# Patient Record
Sex: Male | Born: 1965 | Race: White | Hispanic: No | Marital: Single
Health system: Southern US, Academic
[De-identification: ages and names within clinical notes are randomized; demographics above are authoritative.]

## PROBLEM LIST (undated history)

## (undated) DIAGNOSIS — J45909 Unspecified asthma, uncomplicated: Secondary | ICD-10-CM

## (undated) DIAGNOSIS — J301 Allergic rhinitis due to pollen: Secondary | ICD-10-CM

## (undated) DIAGNOSIS — E785 Hyperlipidemia, unspecified: Secondary | ICD-10-CM

## (undated) DIAGNOSIS — R21 Rash and other nonspecific skin eruption: Secondary | ICD-10-CM

## (undated) DIAGNOSIS — R609 Edema, unspecified: Secondary | ICD-10-CM

## (undated) DIAGNOSIS — M1990 Unspecified osteoarthritis, unspecified site: Secondary | ICD-10-CM

## (undated) DIAGNOSIS — R600 Localized edema: Secondary | ICD-10-CM

## (undated) DIAGNOSIS — M539 Dorsopathy, unspecified: Secondary | ICD-10-CM

## (undated) DIAGNOSIS — G8929 Other chronic pain: Secondary | ICD-10-CM

## (undated) HISTORY — PX: HX CARPAL TUNNEL RELEASE: SHX101

## (undated) HISTORY — PX: HX MENISCECTOMY: SHX123

## (undated) HISTORY — PX: HAND SURGERY: SHX662

## (undated) HISTORY — DX: Hyperlipidemia, unspecified: E78.5

## (undated) HISTORY — DX: Unspecified asthma, uncomplicated: J45.909

## (undated) HISTORY — PX: KNEE SURGERY: SHX244

## (undated) HISTORY — PX: ROTATOR CUFF REPAIR: SHX139

---

## 2004-03-31 ENCOUNTER — Emergency Department (HOSPITAL_COMMUNITY): Admission: EM | Admit: 2004-03-31 | Discharge: 2004-03-31 | Payer: Self-pay | Admitting: *Deleted

## 2007-02-22 ENCOUNTER — Emergency Department: Payer: Self-pay | Admitting: Emergency Medicine

## 2007-02-22 ENCOUNTER — Other Ambulatory Visit: Payer: Self-pay

## 2009-06-14 ENCOUNTER — Emergency Department: Payer: Self-pay | Admitting: Emergency Medicine

## 2009-06-17 ENCOUNTER — Inpatient Hospital Stay: Payer: Self-pay | Admitting: Psychiatry

## 2009-06-17 ENCOUNTER — Emergency Department: Payer: Self-pay

## 2010-10-21 ENCOUNTER — Emergency Department: Payer: Self-pay | Admitting: Emergency Medicine

## 2010-10-23 ENCOUNTER — Emergency Department: Payer: Self-pay | Admitting: Emergency Medicine

## 2011-01-05 ENCOUNTER — Emergency Department (HOSPITAL_COMMUNITY): Payer: No Typology Code available for payment source

## 2011-01-05 ENCOUNTER — Emergency Department (HOSPITAL_COMMUNITY)
Admission: EM | Admit: 2011-01-05 | Discharge: 2011-01-05 | Disposition: A | Discharge status: Home / Self Care | Payer: No Typology Code available for payment source | Attending: Emergency Medicine | Admitting: Emergency Medicine

## 2011-01-05 DIAGNOSIS — S63509A Unspecified sprain of unspecified wrist, initial encounter: Secondary | ICD-10-CM | POA: Insufficient documentation

## 2011-01-05 DIAGNOSIS — Y92410 Unspecified street and highway as the place of occurrence of the external cause: Secondary | ICD-10-CM | POA: Insufficient documentation

## 2011-01-05 DIAGNOSIS — M79609 Pain in unspecified limb: Secondary | ICD-10-CM | POA: Insufficient documentation

## 2011-01-05 DIAGNOSIS — S63502A Unspecified sprain of left wrist, initial encounter: Secondary | ICD-10-CM

## 2011-01-05 MED ORDER — NAPROXEN 500 MG PO TABS: 500.0000 mg | ORAL_TABLET | Freq: Two times a day (BID) | ORAL | Status: DC

## 2011-01-05 MED ORDER — NAPROXEN 500 MG PO TABS: 500.0000 mg | ORAL_TABLET | Freq: Two times a day (BID) | ORAL | Status: AC

## 2011-01-05 MED ORDER — OXYCODONE-ACETAMINOPHEN 5-325 MG PO TABS
2.0000 | ORAL_TABLET | Freq: Once | ORAL | Status: AC
  Administered 2011-01-05: 2 via ORAL

## 2011-01-05 MED ORDER — OXYCODONE-ACETAMINOPHEN 5-325 MG PO TABS
ORAL_TABLET | ORAL | Status: AC
  Filled 2011-01-05: qty 2
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
## 2011-01-05 NOTE — Progress Notes (Signed)
Orthopedic Tech Progress Note Patient Details:  Lee Molina [birth year] [id number] Wrist splint left     Sharilyn Sites 01/05/2011, 9:14 AM
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
## 2012-11-09 IMAGING — CR DG LUMBAR SPINE 2-3V
1 series · 3 of 3 positions shown · non-contrast
Comparison: none

REASON FOR EXAM: pain trauma
COMMENTS:   May transport without cardiac monitor

[Series 1: view not recorded · 0.17mm/px · 3 of 3 slices shown]
[im 1/3]
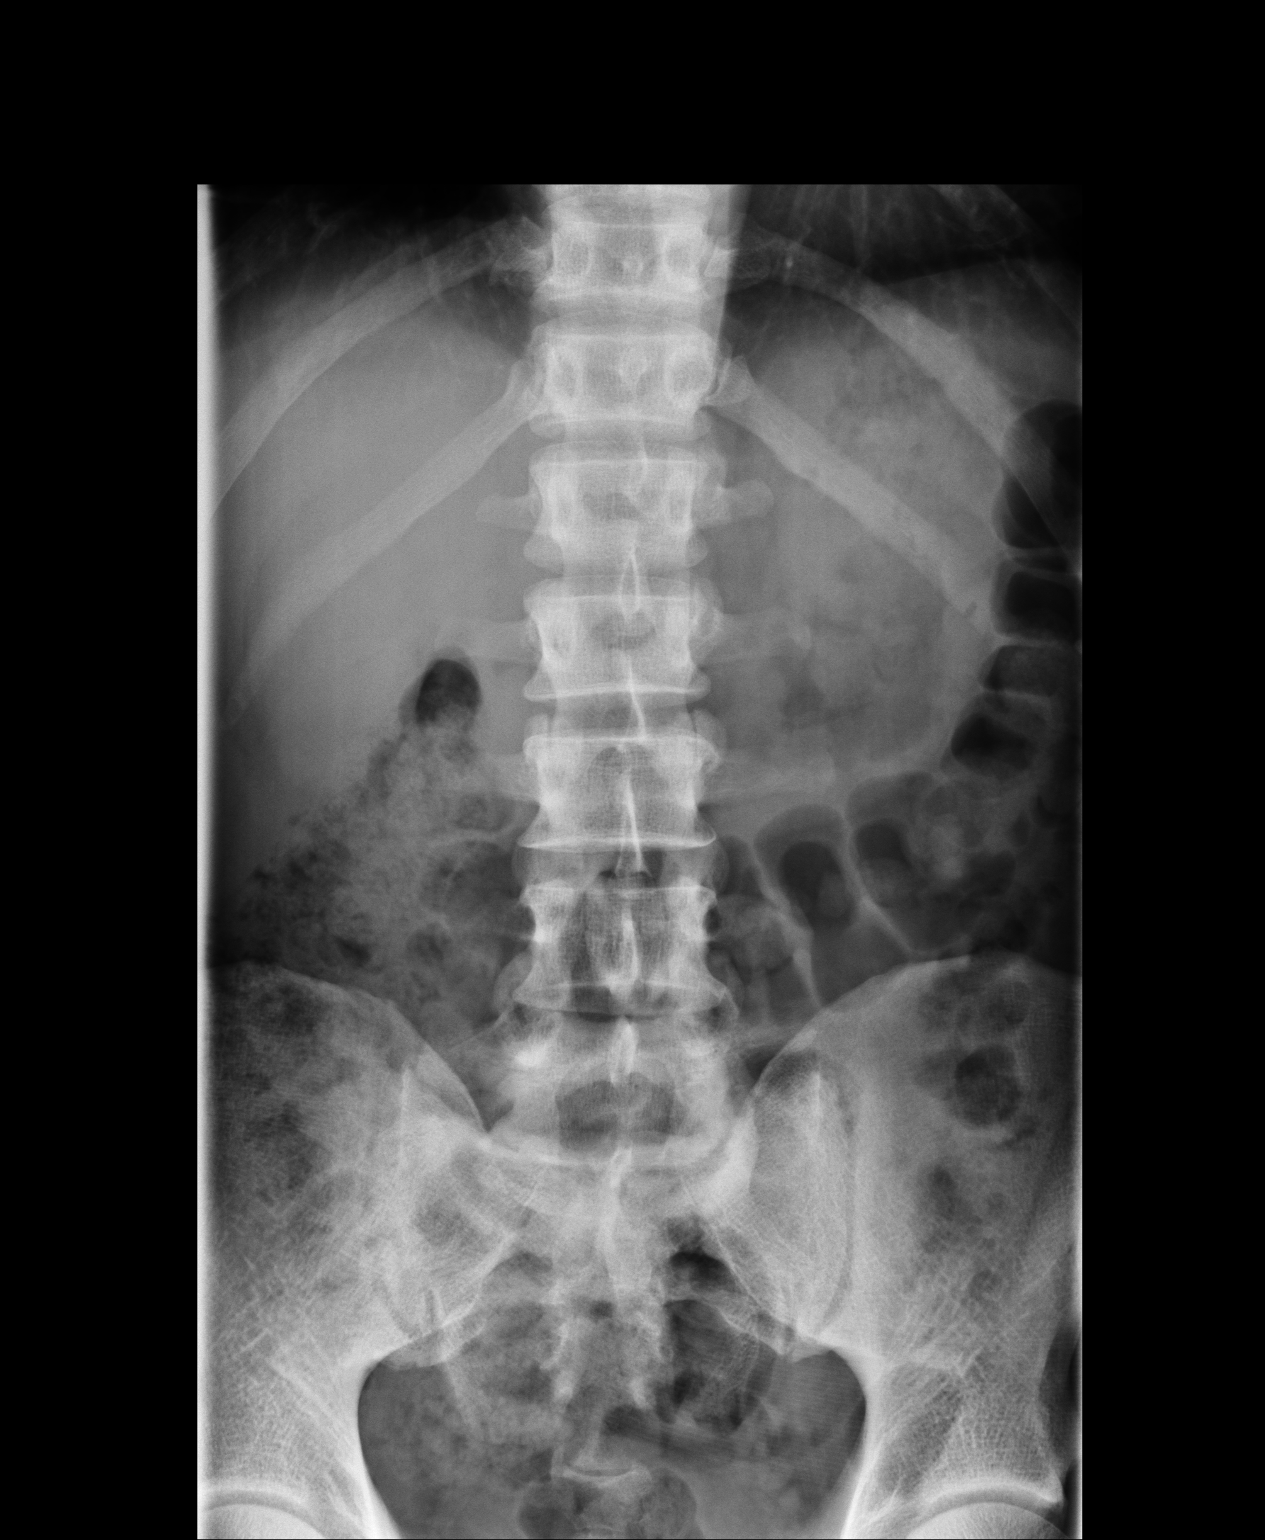
[im 2/3]
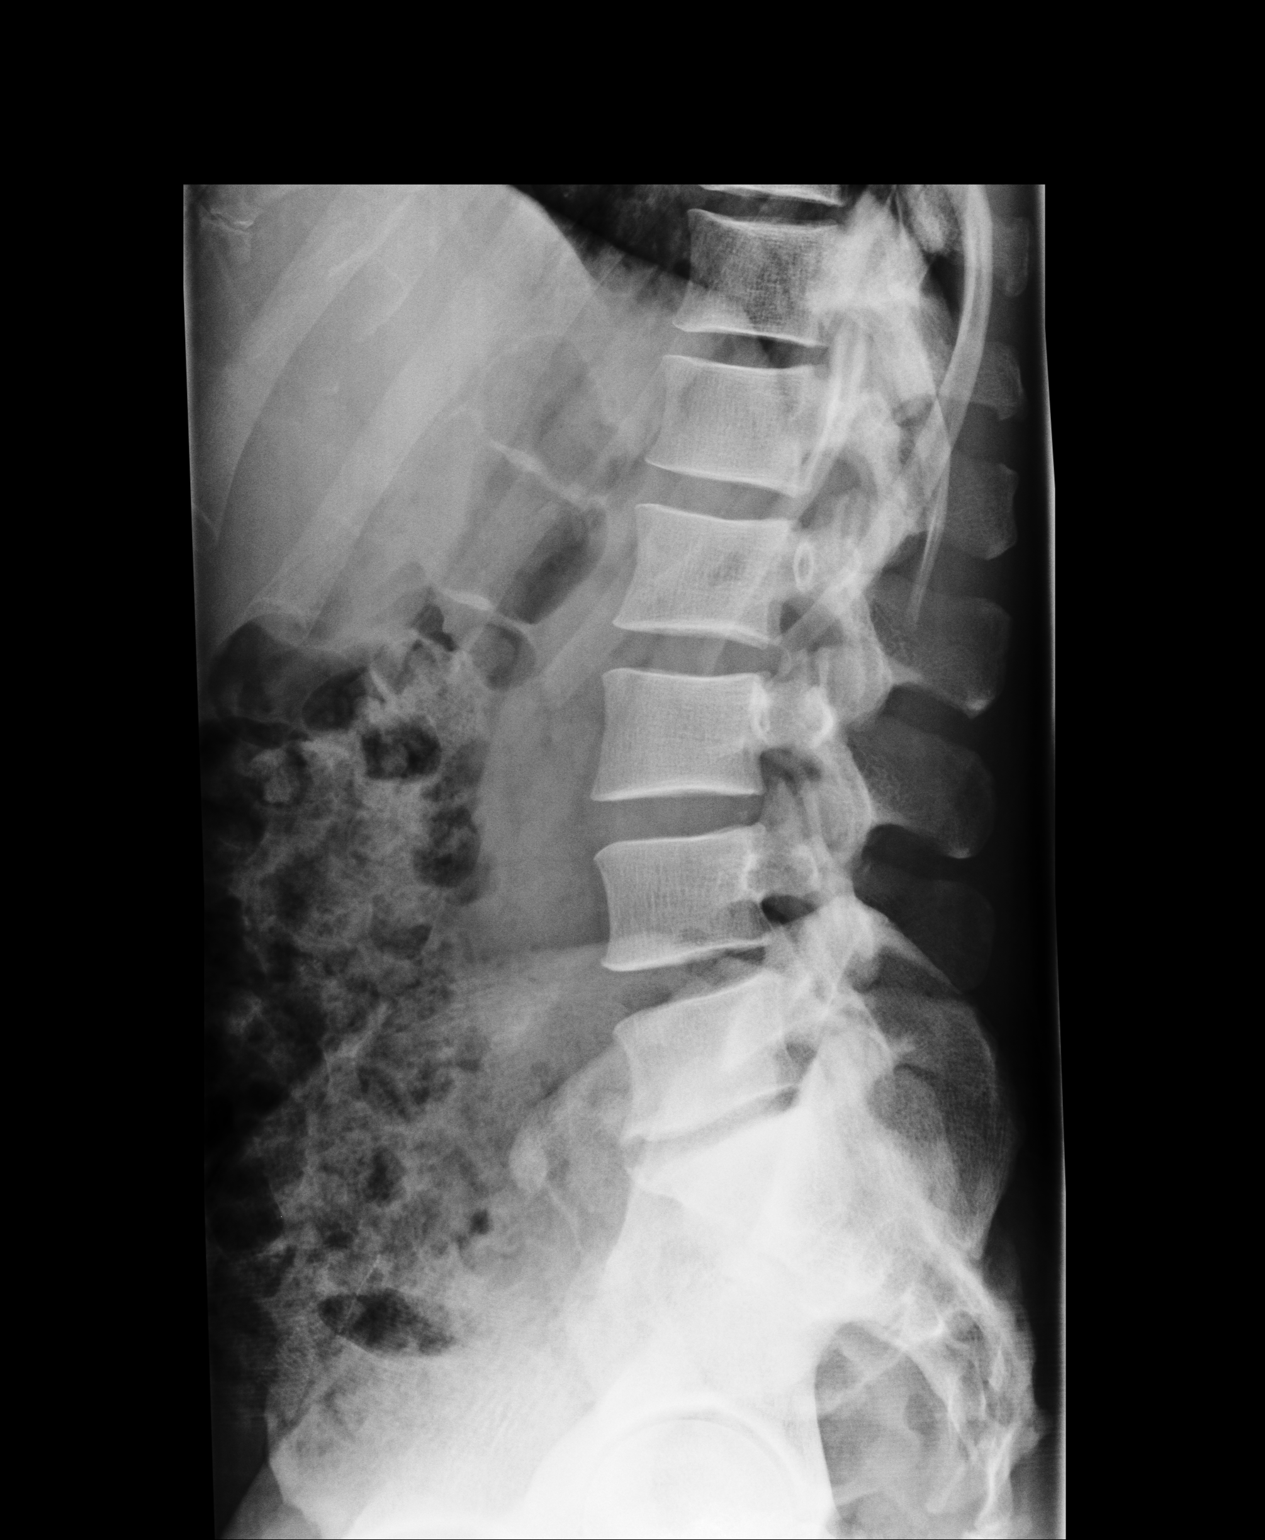
[im 3/3]
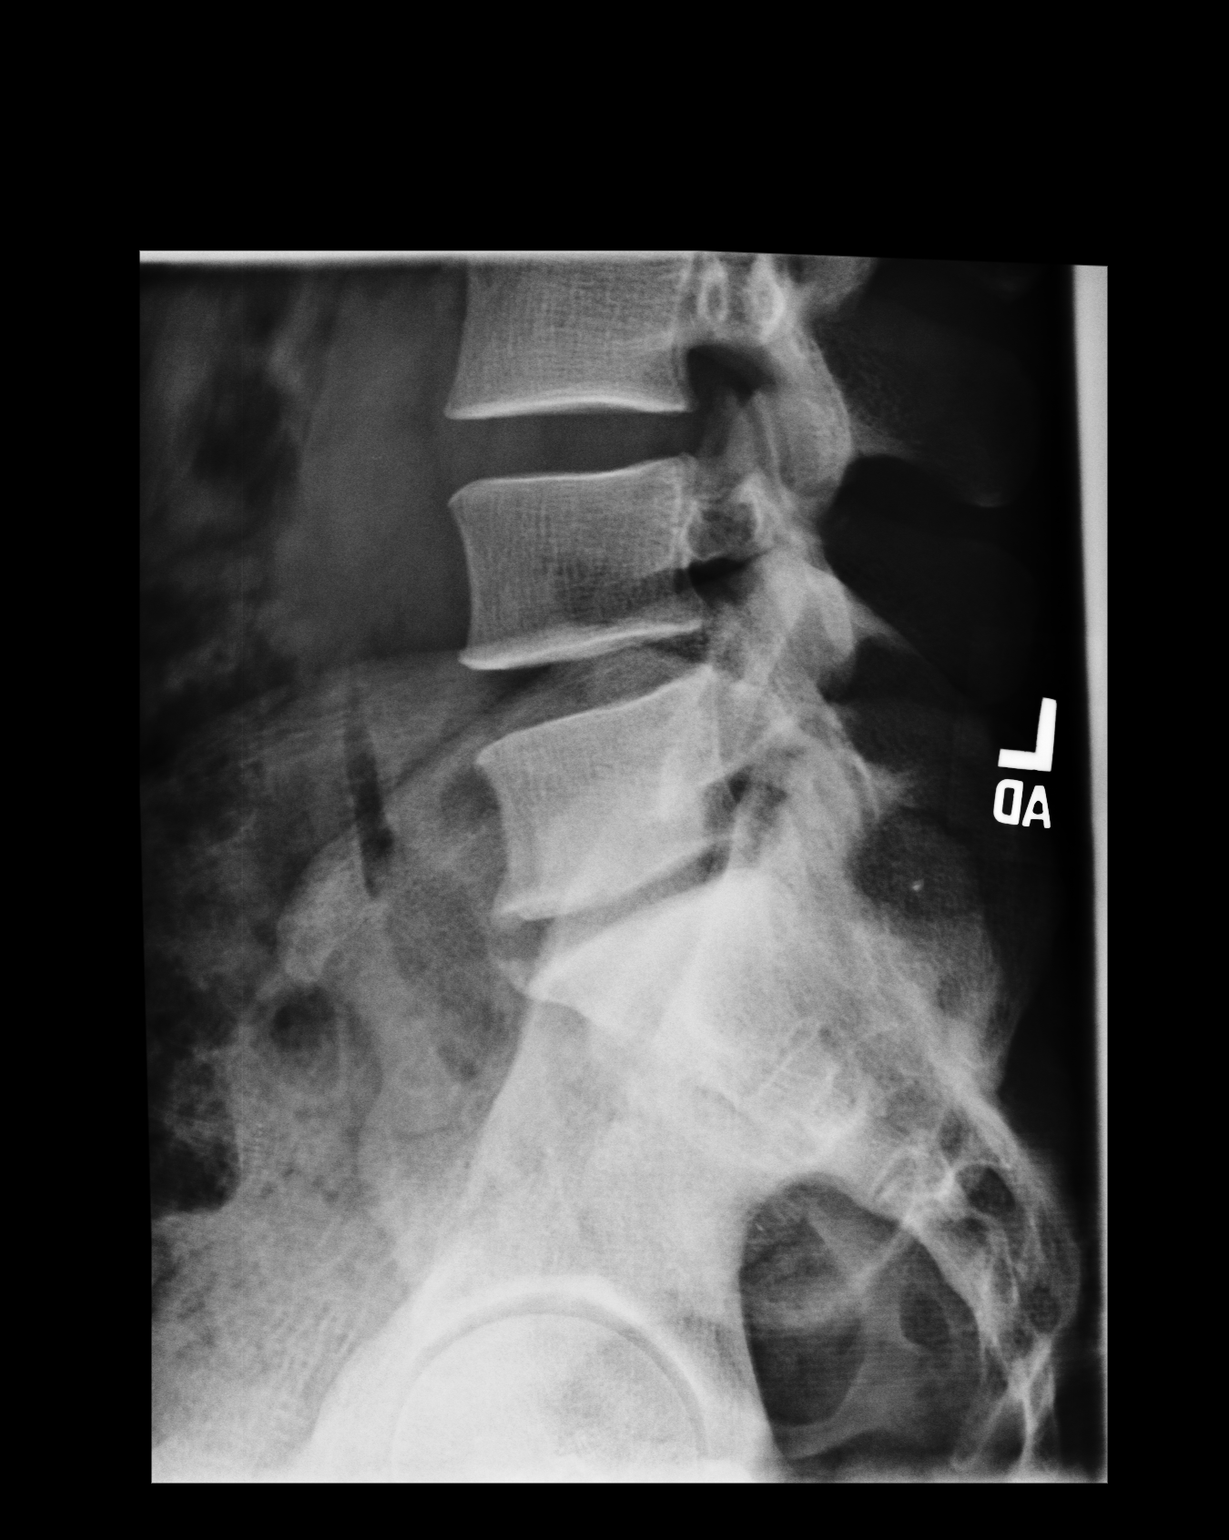

[3 of 3 positions shown; findings below may reference images not displayed]

PROCEDURE:     DXR - DXR LUMBAR SPINE AP AND LATERAL  - October 21, 2010  [DATE]

RESULT:     The vertebral body heights are well-maintained. Vertebral body
alignment is normal. There is mild narrowing of the L5-S1 intervertebral
disc space, unchanged since the prior exam and which may well be
developmental. No progressive narrowing is seen as compared to the prior
exam. The intervertebral disc spaces otherwise are well maintained. The
pedicles are bilaterally intact. No lytic or blastic lesions are seen.
IMPRESSION: 1.     No acute bony abnormalities are identified.

## 2012-11-09 IMAGING — CR DG CHEST 2V
1 series · 2 of 2 positions shown · non-contrast
Comparison: none

REASON FOR EXAM: pain trauma
COMMENTS:   May transport without cardiac monitor

PROCEDURE:     DXR - DXR CHEST PA (OR AP) AND LATERAL  - October 21, 2010  [DATE]
RESULT:     The lung fields are clear. The heart, mediastinal and osseous
structures show no significant abnormalities.

[Series 1: view not recorded · 0.17mm/px · 2 of 2 slices shown]
[im 1/2]
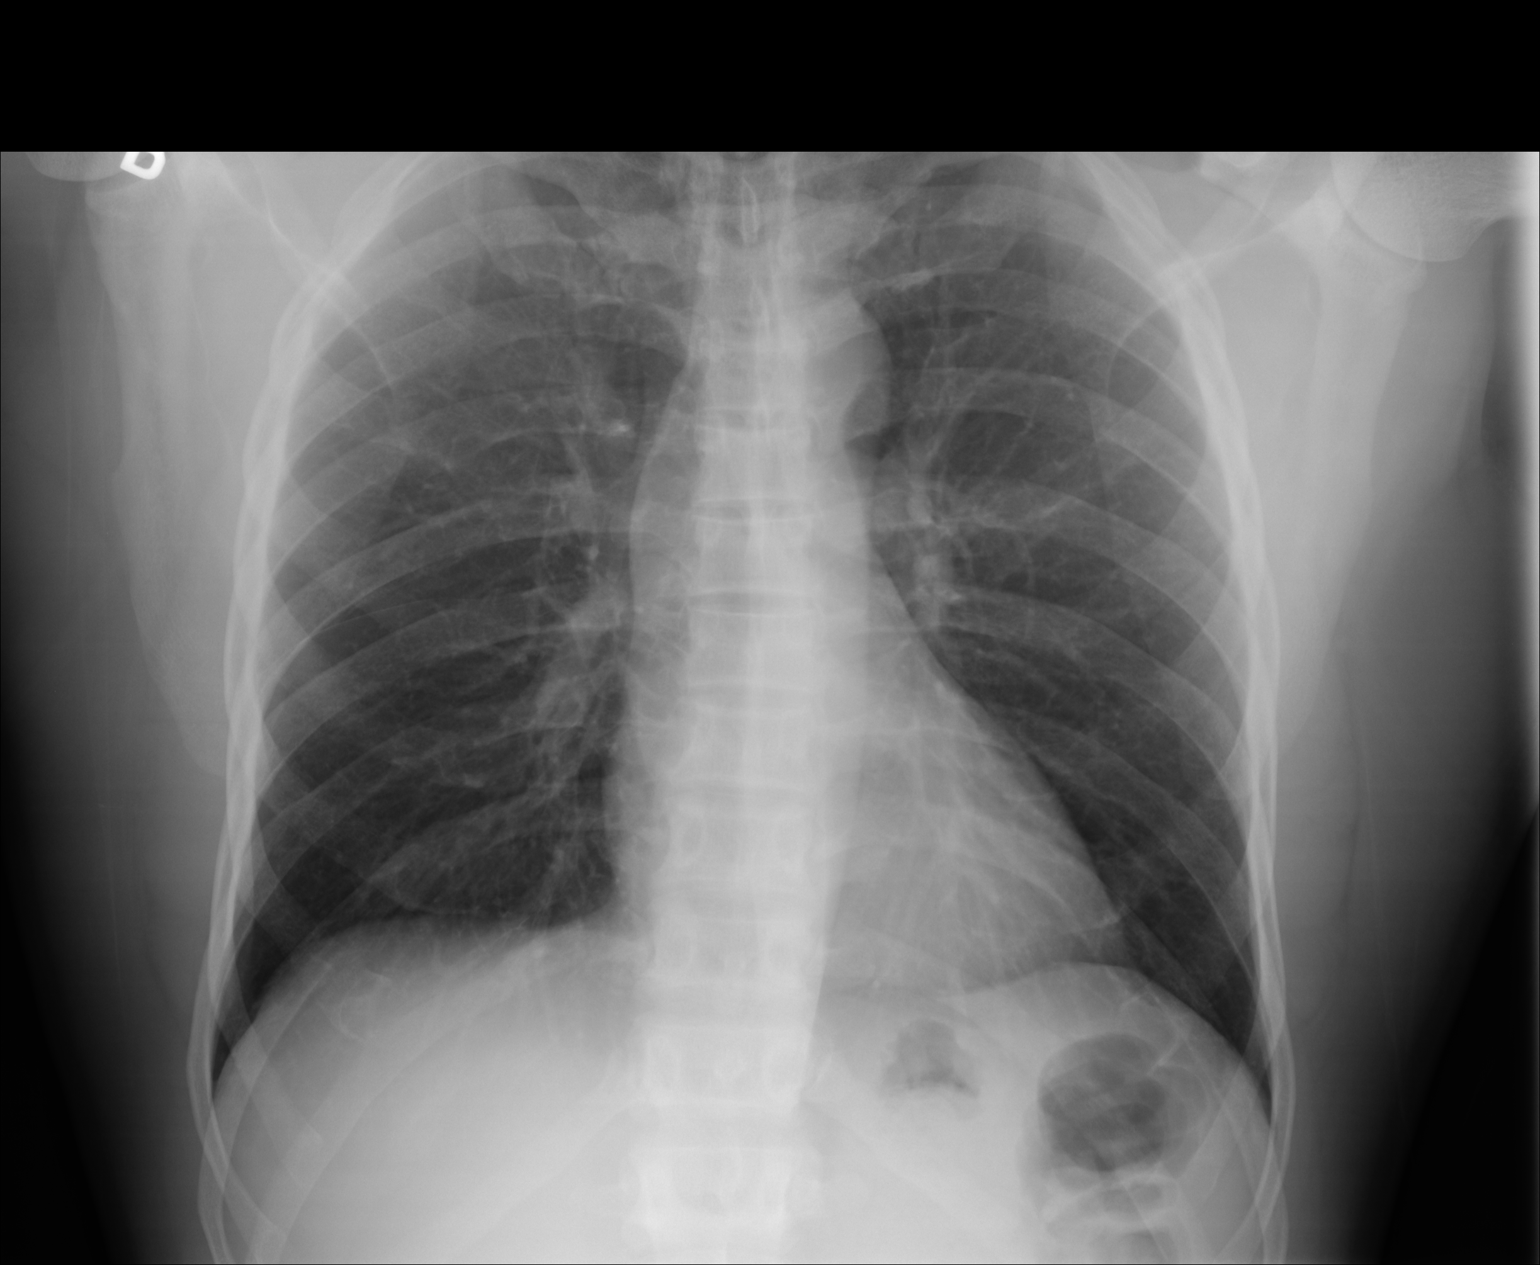
[im 2/2]
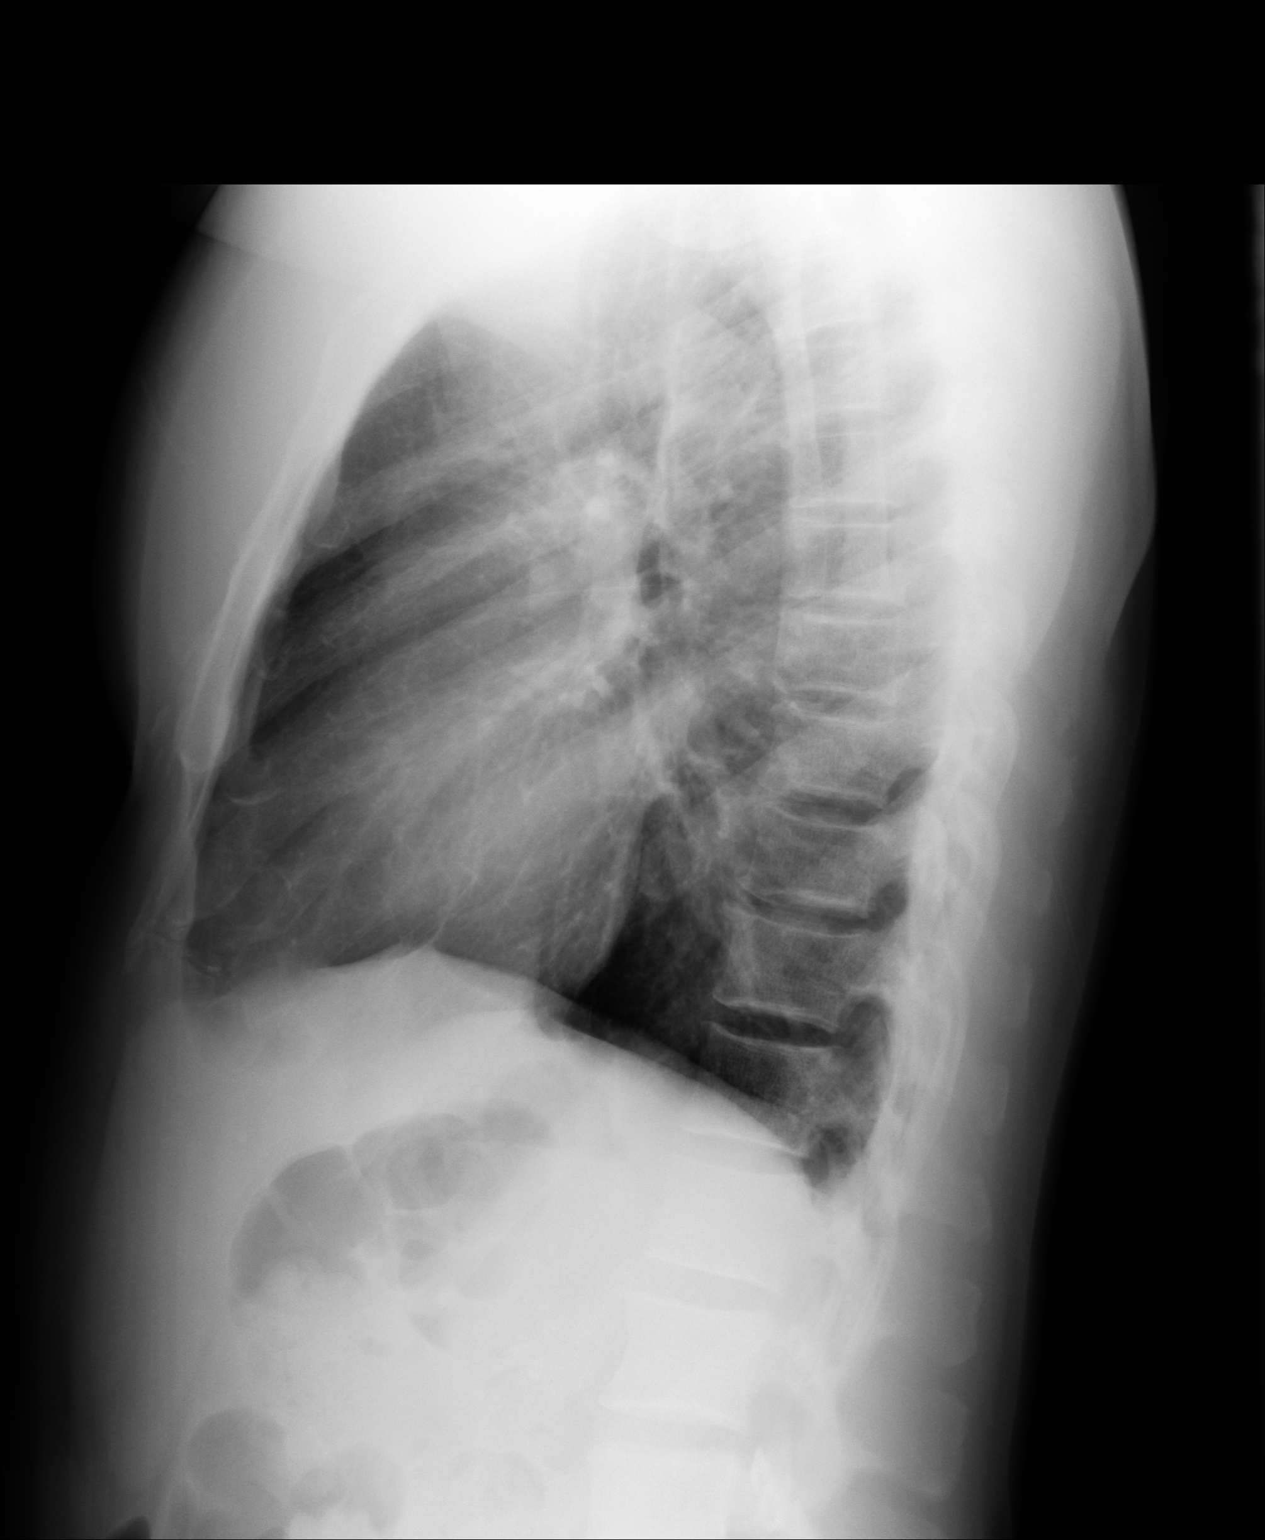

[2 of 2 positions shown; findings below may reference images not displayed]

IMPRESSION: 1.     No acute changes are identified.

## 2016-06-11 ENCOUNTER — Encounter (HOSPITAL_COMMUNITY): Payer: Self-pay

## 2016-06-15 ENCOUNTER — Ambulatory Visit (HOSPITAL_COMMUNITY): Payer: No Typology Code available for payment source | Admitting: Medical-Surgical

## 2016-06-15 ENCOUNTER — Ambulatory Visit
Admission: RE | Admit: 2016-06-15 | Discharge: 2016-06-15 | Disposition: A | Discharge status: Home / Self Care | Payer: No Typology Code available for payment source | Source: Ambulatory Visit | Attending: PHYSICIAN/UNDEFINED PHYSICIAN TYPE | Admitting: PHYSICIAN/UNDEFINED PHYSICIAN TYPE

## 2016-06-15 ENCOUNTER — Encounter (HOSPITAL_COMMUNITY): Payer: Self-pay | Admitting: Medical-Surgical

## 2016-06-15 ENCOUNTER — Encounter (HOSPITAL_COMMUNITY)
Admission: RE | Disposition: A | Discharge status: Home / Self Care | Payer: Self-pay | Source: Ambulatory Visit | Attending: PHYSICIAN/UNDEFINED PHYSICIAN TYPE

## 2016-06-15 DIAGNOSIS — Z79899 Other long term (current) drug therapy: Secondary | ICD-10-CM | POA: Insufficient documentation

## 2016-06-15 DIAGNOSIS — J45909 Unspecified asthma, uncomplicated: Secondary | ICD-10-CM | POA: Insufficient documentation

## 2016-06-15 DIAGNOSIS — X58XXXA Exposure to other specified factors, initial encounter: Secondary | ICD-10-CM | POA: Insufficient documentation

## 2016-06-15 DIAGNOSIS — E785 Hyperlipidemia, unspecified: Secondary | ICD-10-CM | POA: Insufficient documentation

## 2016-06-15 DIAGNOSIS — Z9889 Other specified postprocedural states: Secondary | ICD-10-CM | POA: Insufficient documentation

## 2016-06-15 DIAGNOSIS — M75121 Complete rotator cuff tear or rupture of right shoulder, not specified as traumatic: Secondary | ICD-10-CM | POA: Insufficient documentation

## 2016-06-15 DIAGNOSIS — M1990 Unspecified osteoarthritis, unspecified site: Secondary | ICD-10-CM | POA: Insufficient documentation

## 2016-06-15 DIAGNOSIS — S46211A Strain of muscle, fascia and tendon of other parts of biceps, right arm, initial encounter: Secondary | ICD-10-CM | POA: Insufficient documentation

## 2016-06-15 HISTORY — DX: Edema, unspecified: R60.9

## 2016-06-15 HISTORY — DX: Localized edema: R60.0

## 2016-06-15 HISTORY — DX: Unspecified asthma, uncomplicated: J45.909

## 2016-06-15 HISTORY — DX: Hyperlipidemia, unspecified: E78.5

## 2016-06-15 HISTORY — DX: Rash and other nonspecific skin eruption: R21

## 2016-06-15 HISTORY — DX: Other chronic pain: G89.29

## 2016-06-15 HISTORY — DX: Unspecified osteoarthritis, unspecified site: M19.90

## 2016-06-15 HISTORY — DX: Dorsopathy, unspecified: M53.9

## 2016-06-15 SURGERY — ARTHROSCOPY SHOULDER WITH ROTATOR CUFF REPAIR
Anesthesia: General | Site: Shoulder | Laterality: Right | Wound class: Clean Wound: Uninfected operative wounds in which no inflammation occurred

## 2016-06-15 MED ORDER — PROPOFOL 10 MG/ML IV BOLUS
INJECTION | Freq: Once | INTRAVENOUS | Status: DC | PRN
Start: 2016-06-15 — End: 2016-06-15

## 2016-06-15 MED ORDER — HYDROMORPHONE 2 MG/ML INJECTION SOLUTION
0.2000 mg | INTRAMUSCULAR | Status: DC | PRN
Start: 2016-06-15 — End: 2016-06-15

## 2016-06-15 MED ORDER — EPHEDRINE SULFATE 50 MG/ML INJECTION SOLUTION
Freq: Once | INTRAMUSCULAR | Status: DC | PRN
Start: 2016-06-15 — End: 2016-06-15
  Administered 2016-06-15: 10 mg via INTRAVENOUS
  Administered 2016-06-15: 15 mg via INTRAVENOUS
  Administered 2016-06-15: 20 mg via INTRAVENOUS
  Administered 2016-06-15: 11:00:00 10 mg via INTRAVENOUS

## 2016-06-15 MED ORDER — ROCURONIUM 10 MG/ML INTRAVENOUS SOLUTION
Freq: Once | INTRAVENOUS | Status: DC | PRN
Start: 2016-06-15 — End: 2016-06-15
  Administered 2016-06-15: 30 mg via INTRAVENOUS

## 2016-06-15 MED ORDER — GLYCOPYRROLATE 0.2 MG/ML INJECTION SOLUTION
Freq: Once | INTRAMUSCULAR | Status: DC | PRN
Start: 2016-06-15 — End: 2016-06-15
  Administered 2016-06-15: 0.4 mg via INTRAVENOUS
  Administered 2016-06-15: 0.6 mg via INTRAVENOUS

## 2016-06-15 MED ORDER — SODIUM CHLORIDE 0.9 % (FLUSH) INJECTION SYRINGE
3.00 mL | INJECTION | Freq: Three times a day (TID) | INTRAMUSCULAR | Status: DC
Start: 2016-06-15 — End: 2016-06-15

## 2016-06-15 MED ORDER — NEOSTIGMINE METHYLSULFATE 1 MG/ML INTRAVENOUS SOLUTION
Freq: Once | INTRAVENOUS | Status: DC | PRN
Start: 2016-06-15 — End: 2016-06-15
  Administered 2016-06-15: 5 mg via INTRAVENOUS

## 2016-06-15 MED ORDER — SODIUM CHLORIDE 0.9 % IRRIGATION SOLUTION
Freq: Once | Status: DC | PRN
Start: 2016-06-15 — End: 2016-06-15
  Administered 2016-06-15: 3000 mL

## 2016-06-15 MED ORDER — LIDOCAINE (PF) 100 MG/5 ML (2 %) INTRAVENOUS SYRINGE
INJECTION | Freq: Once | INTRAVENOUS | Status: DC | PRN
Start: 2016-06-15 — End: 2016-06-15
  Administered 2016-06-15: 100 mg via INTRAVENOUS

## 2016-06-15 MED ORDER — LACTATED RINGERS INTRAVENOUS SOLUTION
INTRAVENOUS | Status: DC
Start: 2016-06-15 — End: 2016-06-15

## 2016-06-15 MED ORDER — KETOROLAC 30 MG/ML (1 ML) INJECTION SOLUTION
15.0000 mg | Freq: Four times a day (QID) | INTRAMUSCULAR | Status: DC | PRN
Start: 2016-06-15 — End: 2016-06-15

## 2016-06-15 MED ORDER — ROPIVACAINE (PF) 5 MG/ML (0.5 %) INJECTION SOLUTION
Freq: Once | INTRAMUSCULAR | Status: DC | PRN
Start: 2016-06-15 — End: 2016-06-15
  Administered 2016-06-15: 20 mL

## 2016-06-15 MED ORDER — ONDANSETRON HCL (PF) 4 MG/2 ML INJECTION SOLUTION
4.0000 mg | Freq: Four times a day (QID) | INTRAMUSCULAR | Status: DC | PRN
Start: 2016-06-15 — End: 2016-06-15

## 2016-06-15 MED ORDER — LACTATED RINGERS INTRAVENOUS SOLUTION
INTRAVENOUS | Status: DC
Start: 2016-06-15 — End: 2016-06-15
  Administered 2016-06-15: 0 via INTRAVENOUS

## 2016-06-15 MED ORDER — LIDOCAINE HCL 4 % LARYNGOTRACHEAL SOLUTION
Freq: Once | LARYNGEAL | Status: DC | PRN
Start: 2016-06-15 — End: 2016-06-15
  Administered 2016-06-15: 1 via LARYNGEAL

## 2016-06-15 MED ORDER — HYDROMORPHONE 2 MG/ML INJECTION SOLUTION
0.4000 mg | INTRAMUSCULAR | Status: DC | PRN
Start: 2016-06-15 — End: 2016-06-15

## 2016-06-15 MED ORDER — BUPIVACAINE-EPINEPHRINE (PF) 0.5 %-1:200,000 INJECTION SOLUTION
Freq: Once | INTRAMUSCULAR | Status: DC | PRN
Start: 2016-06-15 — End: 2016-06-15
  Administered 2016-06-15: 20 mL via INTRAMUSCULAR

## 2016-06-15 MED ORDER — CEFAZOLIN 2 GRAM/50 ML IN DEXTROSE (ISO-OSMOTIC) INTRAVENOUS PIGGYBACK
2.00 g | INJECTION | Freq: Once | INTRAVENOUS | Status: AC
Start: 2016-06-15 — End: 2016-06-15
  Administered 2016-06-15: 2 g via INTRAVENOUS

## 2016-06-15 MED ORDER — SODIUM CHLORIDE 0.9 % (FLUSH) INJECTION SYRINGE
3.00 mL | INJECTION | INTRAMUSCULAR | Status: DC | PRN
Start: 2016-06-15 — End: 2016-06-15

## 2016-06-15 MED ORDER — FENTANYL (PF) 50 MCG/ML INJECTION SOLUTION
Freq: Once | INTRAMUSCULAR | Status: DC | PRN
Start: 2016-06-15 — End: 2016-06-15

## 2016-06-15 MED ORDER — ACETAMINOPHEN 1,000 MG/100 ML (10 MG/ML) INTRAVENOUS SOLUTION
Freq: Once | INTRAVENOUS | Status: DC | PRN
Start: 2016-06-15 — End: 2016-06-15
  Administered 2016-06-15: 1000 mg via INTRAVENOUS

## 2016-06-15 MED ORDER — MIDAZOLAM (PF) 5 MG/ML INJECTION SOLUTION
Freq: Once | INTRAMUSCULAR | Status: DC | PRN
Start: 2016-06-15 — End: 2016-06-15
  Administered 2016-06-15: 5 mg via INTRAVENOUS

## 2016-06-15 MED ORDER — OXYCODONE 5 MG TABLET
5.0000 mg | ORAL_TABLET | ORAL | Status: DC | PRN
Start: 2016-06-15 — End: 2016-06-15
  Filled 2016-06-15: qty 1

## 2016-06-15 MED ORDER — HYDROMORPHONE 2 MG/ML INJECTION SOLUTION
Freq: Once | INTRAMUSCULAR | Status: DC | PRN
Start: 2016-06-15 — End: 2016-06-15
  Administered 2016-06-15: .6 mg via INTRAVENOUS
  Administered 2016-06-15: 1 mg via INTRAVENOUS
  Administered 2016-06-15: 0.4 mg via INTRAVENOUS

## 2016-06-15 MED ORDER — PROPOFOL 10 MG/ML IV BOLUS
INJECTION | Freq: Once | INTRAVENOUS | Status: DC | PRN
Start: 2016-06-15 — End: 2016-06-15
  Administered 2016-06-15: 200 mg via INTRAVENOUS

## 2016-06-15 MED ORDER — ONDANSETRON HCL (PF) 4 MG/2 ML INJECTION SOLUTION
Freq: Once | INTRAMUSCULAR | Status: DC | PRN
Start: 2016-06-15 — End: 2016-06-15
  Administered 2016-06-15: 8 mg via INTRAVENOUS

## 2016-06-15 MED ORDER — DEXAMETHASONE SODIUM PHOSPHATE 4 MG/ML INJECTION SOLUTION
Freq: Once | INTRAMUSCULAR | Status: DC | PRN
Start: 2016-06-15 — End: 2016-06-15
  Administered 2016-06-15: 10 mg via INTRAVENOUS

## 2016-06-15 MED ADMIN — nystatin 100,000 unit/gram topical powder: INTRAVENOUS | @ 11:00:00 | NDC 00574200815

## 2016-06-15 MED ADMIN — lactated Ringers intravenous solution: INTRAVENOUS | @ 10:00:00 | NDC 00338011704

## 2016-06-15 MED ADMIN — lidocaine HCL 4 % laryngotracheal solution: LARYNGEAL | @ 10:00:00

## 2016-06-15 MED ADMIN — albumin, human 5 % intravenous solution: @ 14:00:00

## 2016-06-15 SURGICAL SUPPLY — 69 items
ANCHOR SUT 4.5MM BIO-PUSHLOCK SLF PNCH 28MM STRL DISP ×2 IMPLANT
ANCHOR SUT SWIVELOCK C CLOSE EYLT TAPE LOOP VENT BIOCOMP 19.1MM STRL DISP BLU FIBERTAPE ×2 IMPLANT
BANDAGE COFLX NL 5YDX4IN EASYTEAR CHSV CNTRL STRNG SFT (WOUND CARE/ENTEROSTOMAL SUPPLY) ×1
BANDAGE COFLX NL 5YDX4IN STRL CHSV SFT FOAM COMPRESS TAN LF (WOUND CARE SUPPLY) ×1 IMPLANT
BANDAGE KERLIX 4.1YDX4.5IN 6 PLY ABS CRNKL WV LINT FREE COTTON LRG GAUZE STRL LF  DISP (WOUND CARE SUPPLY) ×1 IMPLANT
BANDAGE KERLIX 4.1YDX4.5IN 6 P_LY ABS SFT PCH LINT FR COT LRG (WOUND CARE/ENTEROSTOMAL SUPPLY) ×1
BLADE 10 BD RB-BCK CBNSTL SURG TISS STRL LF  DISP (SURGICAL CUTTING SUPPLIES) ×1 IMPLANT
BLADE 10 BD RB-BCK SHRP CBNSTL_SURG TISS STRL LF (CUTTING ELEMENTS) ×1
BLADE 15 BD RB-BCK CBNSTL SURG TISS STRL LF  DISP (SURGICAL CUTTING SUPPLIES) ×2 IMPLANT
BLADE 15 BD RB-BCK CBNSTL SURG_STRL LF (CUTTING ELEMENTS) ×2
BLADE SAW .98X.36IN OSC SGTL T_HK.02IN MICRO PREC TPS (AID)
BLADE SAW 25X9MM SGTL OSCILLATE SS THK.015MM MED LONG STRL LF (AID) IMPLANT
BLADE SHAVER 4MM FORMULA TOMCAT F SER ARTHRO STRL LF (SURGICAL INSTRUMENTS) ×1 IMPLANT
BLADE SHAVER 4MM FORMULA TOMCA_T F SER ARTHRO STRL LF (INSTRUMENTS) ×1
BLANKET AIR WARM FULL BODY 40068 85 X 37IN LF 10EA/CS (MISCELLANEOUS PT CARE ITEMS) ×2 IMPLANT
CLOSURE SKIN STRIPS 1/2X4IN_R1547 6/PK 50PK/BX (WOUND CARE/ENTEROSTOMAL SUPPLY) ×1
CONV USE ITEM 309809 - PACK SURG ECLIPSE SHLDR2 DRP ADH HKLP LINE HLDR STRL 144X77IN 100X66IN LF (PROTECTIVE PRODUCTS/GARMENTS) IMPLANT
COUNTER 20 CNT MAG DVN BOXLOCKS STRL LF  BLK SHARP 1820 PLASTIC FOAM LRG DISP (NEEDLES & SYRINGE SUPPLIES) ×1 IMPLANT
COUNTER 20 CNT MAG DVN BXLK ST_RL LF BLK SHARP 1820 PLASTIC (NEEDLES & SYRINGE SUPPLIES) ×1
COVER 53X24IN MAYOSTAND PRXM STRL DISP EQP SMS LF (DRAPE/PACKS/SHEETS/OR TOWEL) ×1 IMPLANT
DISC USE ITEM 163322 - SYRINGE LL 20ML LTX STRL MED (NEEDLES & SYRINGE SUPPLIES) ×1 IMPLANT
DISCONTINUED USE ITEM 321235 - NEEDLE SUT MULTIFIRE SCORPION (AID) IMPLANT
DISCONTINUED USE ITEM 91401 - SUTURE 0 UR-6 VICRYL 27IN VIOL BRD COAT ABS (SUTURE/WOUND CLOSURE) ×3 IMPLANT
DRAPE ADH 51X47IN STRDRP LF  STRL DISP SURG CLR (PROTECTIVE PRODUCTS/GARMENTS) ×1 IMPLANT
DRAPE ADH 51X47IN U STRDRP LF_STRL DISP SURG PLASTIC CLR (PROTECTIVE PRODUCTS/GARMENTS) ×1
DRAPE MAYOSTAND CVR 53X24IN PR_XM LF STRL DISP EQP SMS (DRAPE/PACKS/SHEETS/OR TOWEL) ×1
DRAPE REINF FNFLD 90X44IN LF  STRL DISP SURG (EQUIPMENT MINOR) ×1 IMPLANT
DRAPE REINF FNFLD 90X44IN LF_STRL DISP SURG (EQUIPMENT MINOR) ×1
DRESSING XEROFORM 1X8IN 50/BX 8884433301 (WOUND CARE SUPPLY) ×1 IMPLANT
DRESSING XEROFORM 1X8IN 50/BX 8884433301 (WOUND CARE/ENTEROSTOMAL SUPPLY) ×1
ELECTRODE ESURG BLADE PNCL 10FT EDGE STRL PVC DISP BUTTON SWH CORD HLSTR LF  ACPT 3/32IN STD SHAFT (CAUTERY SUPPLIES) ×1 IMPLANT
ELECTRODE ESURG BLADE PNCL 10F_T EDGE STRL DISP BUTTON SWH (CAUTERY SUPPLIES) ×1
ELECTRODE PATIENT RTN 9FT VLAB C30- LB RM PHSV ACRL FOAM CORD NONIRRITATE NONSENSITIZE ADH STRP (CAUTERY SUPPLIES) ×1 IMPLANT
ELECTRODE PATIENT RTN 9FT VLAB_REM C30- LB PLHSV ACRL FOAM (CAUTERY SUPPLIES) ×1
GARMENT COMPRESS MED CALF CENTAURA NYL VASOGRAD LTWT BRTHBL SEQ FIL BLU 18- IN (ORTHOPEDICS (NOT IMPLANTS)) ×1 IMPLANT
GARMENT COMPRESS MED CALF CENT_AURA NYL VASOGRAD LTWT BRTHBL (ORTHOPEDICS (NOT IMPLANTS)) ×1
KIT INSTR BIOABS BIOTENOD DISP STRL (ORTHOPEDICS (NOT IMPLANTS)) ×1 IMPLANT
KIT INSTR BIOABS BIOTENOD DISP_STRL (ORTHOPEDICS (NOT IMPLANTS)) ×1
NEEDLE HYPO  21GA 1.5IN TW SFGLD POLYPROP REG BVL LL SHIELD MECH DEHP-FR IM GRN STRL LF  DISP (NEEDLES & SYRINGE SUPPLIES) ×1 IMPLANT
NEEDLE HYPO 21GA 1.5IN TW SFG_LD POLYPROP REG BVL LL HUB (NEEDLES & SYRINGE SUPPLIES) ×1
NEEDLE SUT MULTIFIRE SCORPION (AID)
PACK DRAPE SHOULDER_DYNJP8421 8EA/CS (PROTECTIVE PRODUCTS/GARMENTS)
PACK SURG ECLIPSE SHLDR2 DRP ADH HKLP LINE HLDR STRL 144X77IN 100X66IN LF (PROTECTIVE PRODUCTS/GARMENTS)
PMP TUBING 13FT CONT WV III DU_ALWAVE ARTHRO STRL DISP (ORTHOPEDICS (NOT IMPLANTS)) ×1
PUMP TUBING 13FT CONT WV III DUALWAVE ARTHRO STRL DISP (ORTHOPEDICS (NOT IMPLANTS)) ×1 IMPLANT
SCREW INTFR TENODESIS 7MM 23MM BIOCOMP ACL STRL (Screw) ×1 IMPLANT
SPONGE GAUZE 4X4IN XD STRL_7317 VISTEC 10/TY 128TY/CS (WOUND CARE/ENTEROSTOMAL SUPPLY) ×2
SPONGE GAUZE NON STRL 4 X 4IN 2634 (WOUND CARE SUPPLY) ×1 IMPLANT
SPONGE GAUZE NON STRL 4 X 4IN 2634 (WOUND CARE/ENTEROSTOMAL SUPPLY) ×1
SPONGE SURG 4X4IN 16 PLY RADOPQ BAND VISTEC STRL LF  BLU WHT (WOUND CARE SUPPLY) ×2 IMPLANT
STKNT ORTHO 30X6IN PLSTR CNVRT IMPRV DRP LF  SM STRL DISP (ORTHOPEDICS (NOT IMPLANTS)) ×1 IMPLANT
STOCKINETTE IMPERVIOUS 6IN X_30IN 30/CS (ORTHOPEDICS (NOT IMPLANTS)) ×1
STRIP 4X.5IN STRSTRP PLSTR REINF SKNCLS WHT STRL LF (WOUND CARE SUPPLY) ×1 IMPLANT
SUCTION YANKAUER STRL_DYND50130 50/CS (INSTRUMENTS) ×1
SUTURE 0 UR-6 VICRYL 27IN VIOL_BRD COAT ABS (SUTURE/WOUND CLOSURE) ×3
SUTURE 2 T-5 FIBERWIRE 38IN BLU BRD TIE MS LWR KNT PROF NONAB (SUTURE/WOUND CLOSURE) IMPLANT
SUTURE 2 T-5 FIBERWIRE 38IN MS_LWR KNT PRFL NONAB (SUTURE/WOUND CLOSURE)
SUTURE 2-0 CT1 VICRYL 27IN UNDYED BRD COAT ABS (SUTURE/WOUND CLOSURE) ×2 IMPLANT
SUTURE 2-0 CT1 VICRYL 27IN UND_YED BRD COAT ABS (SUTURE/WOUND CLOSURE) ×2
SUTURE 2-0 FS PROLENE 18IN BLU MONOF NONAB (SUTURE/WOUND CLOSURE) ×1 IMPLANT
SUTURE 2-0 FS PROLENE 18IN BLU_MONOF NONAB (SUTURE/WOUND CLOSURE) ×1
SUTURE ANCHOR BIOPUS_AR1922BM EA ×2 IMPLANT
SYRINGE 60ML LF  STRL LID SFT BULB TIP IRRG TVK (NEEDLES & SYRINGE SUPPLIES) ×1 IMPLANT
SYRINGE 60ML LF STRL BULB TIP_LID IRRG TVK (NEEDLES & SYRINGE SUPPLIES) ×1
SYRINGE LL 20ML LTX STRL MED (NEEDLES & SYRINGE SUPPLIES) ×1
TAPE SURGICAL MEDIPORE 4IN CS/12 EA (MED/SURG TAPES) ×2 IMPLANT
TIP SUCT YANKAUER STD BULB PLASTIC 5IN 1 CONN RIGID CLR STRL LF (SURGICAL INSTRUMENTS) ×1 IMPLANT
TUBING SUCT 10FT 3/16IN UNIV NCDTV FEMALE CONN STRL LF (Suction) ×3 IMPLANT
TUBING SUCT STRL 3/16IN X 10FT_DYND50221 50/CS (Suction) ×3

## 2016-06-15 NOTE — Addendum Note (Signed)
Addendum  created 06/15/16 1137 by Jill Side, CRNA    Sign clinical note

## 2016-06-15 NOTE — Care Plan (Signed)
Problem: Pain, Acute (Adult)  Goal: Identify Related Risk Factors and Signs and Symptoms  Related risk factors and signs and symptoms are identified upon initiation of Human Response Clinical Practice Guideline (CPG).   Outcome: Completed Date Met: 06/15/16

## 2016-06-15 NOTE — Nurses Notes (Signed)
DC INSTRUCTIONS, PAIN MGMT, SED INFORMATION, SCRIPT INFO AND FALL PRECAUTIONS GIVEN TO PT AND C.O KENNY CARR. ENCOURAGED QUESTIONS AND ANSWERED. BOTH VOICE UNDERSTANDING AND DENIES FURTHER QUESTIONS.

## 2016-06-15 NOTE — Care Plan (Signed)
Problem: Fall Risk (Adult)  Goal: Identify Related Risk Factors and Signs and Symptoms  Related risk factors and signs and symptoms are identified upon initiation of Human Response Clinical Practice Guideline (CPG).   Outcome: Completed Date Met: 06/15/16

## 2016-06-15 NOTE — Anesthesia Procedure Notes (Addendum)
Block: Peripheral Block     Sedation    Sedation Start Time  06/15/2016 9:07 AM   Sedation Stop Time 06/15/2016 9:27 AM  Blocks  Block Type: interscalene   Diagnosis: shoulder pain   Indication: pain management  Laterality: Right    Site verified, H&P updated and consent obtained, Patient monitors applied, Timeout performed, Emergency drugs and equipment available, Patient positioned and anesthesia consent given  Technique(See MAR for doses)  Type of Block: single shot        Preprocedure hand washing was performed; sterile field was maintained with sterile gloves, gown, cap, mask, and sterile large drape   Ultrasound used- sterile ultrasound gel, and with sterile ultrasound cover. Ultrasound guidance for needle placement, imaging, supervision, and interpretation. Ultrasound image archived   Nerve stimulator used :with twitch faded at the current intensity of 0.53mA  Sterile Skin Prep : Chlorhexidine and Sterile gloves, Sterile gown, Sterile technique, Sterilely prepped and draped, Supplies assembled on sterile field, Sterile drape, Sterile field established and Mask    chlorhexidine    Skin Local  Local amount 2 mL. lidocaine 1%   Needle  Needle type: Stimuplex     Needle Gauge: 20G   Needle length: 3 in.  Number of attempts: 1      Catheter        NOTES: Deltoid twitch present at 0.5 mA, decreased to 0.2 mA, twitch absent, (-) aspiration, injection initiated, spread noted within interscalene groove, pressure less than 15 during length of injection, 20 ml 0.5% Ropivacaine injected. Patient tolerated well with no complications.          Site    Assessment       Events  inserted easily and complications none   Patient tolerance of procedure: tolerated well, no immediate complications and patient was uncooperative during the procedure        Performed By:    CRNA/Resident: Jill Side

## 2016-06-15 NOTE — Discharge Instructions (Signed)
YOU HAVE A FOLLOW UP APPT WITH DR. HAHN IN [facility] OFFICE ON April 30TH @ 2PM.     YOU ARE RECEIVING A PRESCRIPTION FOR PERCOCET FOR PAIN AS NEEDED (HIGH ALERT DRUG)    [facility] PATIENT SAFETY INSTRUCTION FOR INTERSCALINE NERVE BLOCK.   1. You may have the following symptoms that are temporary; these symptoms will resolve in three to four hours: drooping of your eyelid on the affected side, hoarseness of your voice, difficulty swallowing - begin by drinking sips of water; if able to swallow without difficulty, proceed to solid foods, numbness and decreased use of the hand on the affected side.  2. Safety precautions for you: wear your sling at all times until the feeling returns to the affected arm, protect your arm from burns, lacerations, or there injury until the feeling returns, do not try to sue your arm or hand until the feeling returns.  3. Please call [phone] or [phone] with any questions or concerns Monday through Friday 6:30 am to 3pm.   4. In the event of an emergency, please call your physician or go to the nearest emergency room for treatment.

## 2016-06-15 NOTE — Care Plan (Signed)
Problem: Fall Risk (Adult)  Goal: Absence of Falls  Patient will demonstrate the desired outcomes by discharge/transition of care.   Outcome: Completed Date Met: 06/15/16

## 2016-06-15 NOTE — H&P (Signed)
H&P update    1. H&P was completed within 30 days of the surgical procedure by Dr. Raleigh Jon or a member of his staff.  This has been reviewed and verified within 24 hours of the day of surgery. The patient has been reexamined no changes occurred in the patient's condition since the H&P was completed.    Changes in medication:  no    2.  The patient continues to be an appropriate candidate for the planned surgical procedure. Yes

## 2016-06-15 NOTE — OR Surgeon (Signed)
Preoperative diagnosis: Right shoulder pain and rotator cuff tear  Postoperative diagnosis: Same 2. Biceps tendon tear 3. irrepairable rotator cuff tear    Procedure:  Right shoulder arthroscopy with open rotator cuff repair and debridement 2. Biceps tenodesis  Surgeon: Mabeline Caras      Anesthesia: Gen.  With scalene block    Blood loss: Less than 10 mL    Complication: None.    Indication for procedure: Patient is a very pleasant  Right-hand-dominant individualwho unfortunately has continued shoulder pain following an injury to the shoulder.  They have failed injections, anti-inflammatories, as well as physical therapy.  They're imaging studies are consistent with a rotator cuff tear tear.  Because of their interference with activities of daily living and continued pain, as well as failure of conservative measures  Operative intervention was warranted.  I discussed the risks and benefits of the planned procedure with the patient and their family.  These include but not limited to bleeding, infection, damage to nerves, blood vessels, risk of anesthesia, stroke, heart attack, blood clot, and death.  They verbalized understanding of these, and asked appropriate questions and I believe they had informed consent.  Description of procedure: After the appropriate marking of the patient and informed consent patient was brought the operating room, laid supine position.  After induction of general anesthesia and prepped and draped in usual sterile fashion and placed patient in a beachchair position.  I then performed a surgical pause verifying the appropriate sites, side and that antibiotics of 2 g of Kefzol was given prior to start.  I then infiltrated the joint using Marcaine half percent with epinephrine.  Using a 18-gauge spinal needle.  Stab incision was made posteriorly and the blunt trocar was advanced into the joint.  Arthroscope was advanced.  Upon entering the joint, identified the biceps tendon, had an obvious  tear.  The glenohumeral joint had no obvious changes.  Rotator cuff showed a  massive tear in the supraspinatus.  I then created an anterior portal under fine needle localization.  A blunt trocar was introduced and explored the shoulder.  Using the blunt probe was able to identify the anterior aspect of the labrum,  which was normal.     Having released the torn portion of the biceps tendon using a sharp arthroscopic biter and 4 0 shaver.  I then made a separate incision anteriorly using the deltopectoral interval.   I used blunt dissection to develop the rotator interval and identified the conjoined tendon.  Our retractors were placed.  Subdeltoid bursa was excised.    The stump of the biceps tendon was identified.  I then placed a running interlocking 2. FiberWire suture.  The biceps was then tenodesed to a drill hole at the level of the bicipital groove.  Interval was closed with a 2. FiberWire.  The supraspinatus tear was identified.     I made several attempts at releasing the adhesions  Of the supraspinatus and the tendon itself was frayed beyond repair.  It was retracted to the level of the glenoid.  There was no possible way to over sew the repair front to back.   This point I took the anterior aspect of the rotator interval and minimal portion of the supraspinatus tendon that was able to be repaired.  I placed a SwiveLock anchor and then in a single row fixation was able to  Repair this anteriorly to hopefully prevent anterior escape.    I debrided the frayed portions of the  supraspinatus that were remaining as well as infraspinatus.  I irrigated out the wound copiously.  And terminated the procedure..    I then closed the fascia using 2-0 Vicryl.  The skin was closed 2-0 Vicryl and Prolene.  A sterile well-padded dressing was applied.  The patient was placed in a sling, awakened, taken recovery room in stable condition.

## 2016-06-15 NOTE — Nurses Notes (Signed)
PT SITTING UP IN BED AT THIS TIME EATING LUNCH AND TOLERATING WELL. DENIES COMPLAINTS OF PAIN OR DISCOMFORT. STATES RIGHT ARM REMAINS NUMB, UNABLE TO WIGGLE FINGERS WITH CAP REFILL <3.

## 2016-06-15 NOTE — OR PreOp (Signed)
DISCUSSED PAIN MGMT INFORMATION WITH PT AND OFFICER. BOTH VOICED UNDERSTANDING AND DENIES QUESTIONS

## 2016-06-15 NOTE — Nurses Notes (Signed)
CALLED AND SPOKE WITH NURSE AT FACILITY TO GIVE REPORT. DENIES QUESTIONS.
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
## 2016-06-15 NOTE — Care Plan (Signed)
Problem: Pain, Acute (Adult)  Goal: Acceptable Pain Control/Comfort Level  Patient will demonstrate the desired outcomes by discharge/transition of care.   Outcome: Outcome Achieved Date Met: 06/15/16

## 2016-06-15 NOTE — Anesthesia Postprocedure Evaluation (Addendum)
Anesthesia Post Op Evaluation    Patient: Seth Olsen  Procedure(s) with comments:  ARTHROSCOPY SHOULDER WITH ROTATOR CUFF REPAIR - 06/11/2016 Spoke with Dorathy Daft at the [facility] about this pt, she will fax info, labs, EKG to [place]. Pre-op instructions given to [facility] included NPO, meds, etc with verbal understanding.     Last Vitals:Temperature: 36.9 C (98.4 F) (06/15/16 1130)  Heart Rate: 66 (06/15/16 1130)  BP (Non-Invasive): 121/76 (06/15/16 1130)  Respiratory Rate: 13 (06/15/16 1130)  SpO2-1: 94 % (06/15/16 1130)  Patient is sufficiently recovered from the effects of anesthesia to participate in the evaluation and has returned to their pre-procedure level.  Patient location during evaluation: PACU   Post-procedure handoff checklist completed    Patient participation: complete - patient participated  Level of consciousness: awake and alert and responsive to verbal stimuli  Pain score: 0  Pain management: adequate  Airway patency: patent  Anesthetic complications: no  Cardiovascular status: acceptable  Respiratory status: acceptable and nasal cannula  Hydration status: acceptable  Patient post-procedure temperature: Pt Normothermic   PONV Status: Absent

## 2016-06-15 NOTE — Anesthesia Preprocedure Evaluation (Signed)
ANESTHESIA PRE-OP EVALUATION  Planned Procedure: ARTHROSCOPY SHOULDER WITH ROTATOR CUFF REPAIR (Right Shoulder)  Review of Systems     anesthesia history negative     patient summary reviewed  nursing notes reviewed        Pulmonary   asthma and rescue inhaler,   Cardiovascular   NYHA Classification:I  ECG reviewed No peripheral edema,  Exercise Tolerance: good        GI/Hepatic/Renal   negative GI/hepatic/renal ROS,      Endo/Other   neg endo/other ROS,        Neuro/Psych/MS   back abnormality     Cancer  negative hematology/oncology ROS,                Physical Assessment      Patient summary reviewed and Nursing notes reviewed   Airway       Mallampati: II    TM distance: >3 FB    Neck ROM: full    Facial hair  Beard  No endotracheal tube present  No Tracheostomy present    Dental       Dentition intact             Pulmonary    Breath sounds clear to auscultation  (-) no rhonchi, no decreased breath sounds, no wheezes, no rales and no stridor     Cardiovascular    Rhythm: regular  Rate: Normal  (-) no friction rub, carotid bruit is not present, no peripheral edema and no murmur     Other findings            Plan  Planned anesthesia type: general    ASA 2     Intravenous induction     Anesthetic plan and risks discussed with patient.     Anesthesia issues/risks discussed are: Dental Injuries, PONV, Post-op Cognitive Dysfunction, Failure of Block, Aspiration and Sore Throat.    Use of blood products discussed with patient whom.     Patient's NPO status is appropriate for Anesthesia.           Plan discussed with CRNA.

## 2018-08-23 ENCOUNTER — Emergency Department: Payer: No Typology Code available for payment source

## 2018-08-23 ENCOUNTER — Other Ambulatory Visit: Payer: Self-pay

## 2018-08-23 ENCOUNTER — Emergency Department
Admission: EM | Admit: 2018-08-23 | Discharge: 2018-08-23 | Disposition: A | Discharge status: Home / Self Care | Payer: No Typology Code available for payment source | Attending: Emergency Medicine | Admitting: Emergency Medicine

## 2018-08-23 DIAGNOSIS — Y9389 Activity, other specified: Secondary | ICD-10-CM | POA: Insufficient documentation

## 2018-08-23 DIAGNOSIS — M5410 Radiculopathy, site unspecified: Secondary | ICD-10-CM | POA: Insufficient documentation

## 2018-08-23 DIAGNOSIS — Y998 Other external cause status: Secondary | ICD-10-CM | POA: Diagnosis not present

## 2018-08-23 DIAGNOSIS — M549 Dorsalgia, unspecified: Secondary | ICD-10-CM | POA: Diagnosis present

## 2018-08-23 DIAGNOSIS — Y92410 Unspecified street and highway as the place of occurrence of the external cause: Secondary | ICD-10-CM | POA: Insufficient documentation

## 2018-08-23 DIAGNOSIS — M5412 Radiculopathy, cervical region: Secondary | ICD-10-CM | POA: Insufficient documentation

## 2018-08-23 DIAGNOSIS — R200 Anesthesia of skin: Secondary | ICD-10-CM | POA: Insufficient documentation

## 2018-08-23 MED ORDER — METHYLPREDNISOLONE 4 MG PO TBPK: ORAL_TABLET | ORAL | 0 refills | Status: DC

## 2018-08-23 MED ORDER — ORPHENADRINE CITRATE ER 100 MG PO TB12: 100.0000 mg | ORAL_TABLET | Freq: Two times a day (BID) | ORAL | 0 refills | Status: DC

## 2018-08-23 MED ORDER — TRAMADOL HCL 50 MG PO TABS
50.0000 mg | ORAL_TABLET | Freq: Once | ORAL | Status: AC
  Administered 2018-08-23: 50 mg via ORAL
  Filled 2018-08-23: qty 1

## 2018-08-23 MED ORDER — TRAMADOL HCL 50 MG PO TABS
50.0000 mg | ORAL_TABLET | Freq: Four times a day (QID) | ORAL | 0 refills | Status: AC | PRN
Start: 2018-08-23 — End: 2018-08-26

## 2018-08-23 MED ORDER — PREDNISONE 20 MG PO TABS
60.0000 mg | ORAL_TABLET | Freq: Once | ORAL | Status: AC
  Administered 2018-08-23: 60 mg via ORAL
  Filled 2018-08-23: qty 3

## 2018-08-23 NOTE — ED Notes (Signed)
Reference triage note. Pt ambulatory to ED room. Pt in NAD at this time.
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
## 2018-08-23 NOTE — ED Notes (Signed)
Reference triage note. Pt ambulatory to ED room. Pt with steady gait. Pt in NAD.

## 2018-08-23 NOTE — Discharge Instructions (Signed)
Take medication as directed.
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
## 2019-01-13 DIAGNOSIS — M7501 Adhesive capsulitis of right shoulder: Secondary | ICD-10-CM | POA: Insufficient documentation

## 2019-01-13 DIAGNOSIS — M19011 Primary osteoarthritis, right shoulder: Secondary | ICD-10-CM | POA: Insufficient documentation

## 2019-11-10 ENCOUNTER — Encounter: Payer: Self-pay | Admitting: Emergency Medicine

## 2019-11-10 ENCOUNTER — Emergency Department: Payer: No Typology Code available for payment source

## 2019-11-10 ENCOUNTER — Other Ambulatory Visit: Payer: Self-pay

## 2019-11-10 ENCOUNTER — Emergency Department
Admission: EM | Admit: 2019-11-10 | Discharge: 2019-11-10 | Disposition: A | Discharge status: Home / Self Care | Payer: No Typology Code available for payment source | Attending: Student in an Organized Health Care Education/Training Program | Admitting: Student in an Organized Health Care Education/Training Program

## 2019-11-10 DIAGNOSIS — X58XXXA Exposure to other specified factors, initial encounter: Secondary | ICD-10-CM | POA: Diagnosis not present

## 2019-11-10 DIAGNOSIS — S62601A Fracture of unspecified phalanx of left index finger, initial encounter for closed fracture: Secondary | ICD-10-CM | POA: Diagnosis not present

## 2019-11-10 DIAGNOSIS — Y929 Unspecified place or not applicable: Secondary | ICD-10-CM | POA: Insufficient documentation

## 2019-11-10 DIAGNOSIS — S60921A Unspecified superficial injury of right hand, initial encounter: Secondary | ICD-10-CM | POA: Diagnosis present

## 2019-11-10 DIAGNOSIS — T148XXA Other injury of unspecified body region, initial encounter: Secondary | ICD-10-CM

## 2019-11-10 DIAGNOSIS — Z23 Encounter for immunization: Secondary | ICD-10-CM | POA: Insufficient documentation

## 2019-11-10 DIAGNOSIS — Y939 Activity, unspecified: Secondary | ICD-10-CM | POA: Diagnosis not present

## 2019-11-10 DIAGNOSIS — Y999 Unspecified external cause status: Secondary | ICD-10-CM | POA: Insufficient documentation

## 2019-11-10 DIAGNOSIS — S61412A Laceration without foreign body of left hand, initial encounter: Secondary | ICD-10-CM | POA: Insufficient documentation

## 2019-11-10 HISTORY — DX: Allergic rhinitis due to pollen: J30.1

## 2019-11-10 MED ORDER — ONDANSETRON 4 MG PO TBDP: 4.0000 mg | ORAL_TABLET | Freq: Three times a day (TID) | ORAL | 0 refills | Status: DC | PRN

## 2019-11-10 MED ORDER — TETANUS-DIPHTH-ACELL PERTUSSIS 5-2.5-18.5 LF-MCG/0.5 IM SUSP
0.5000 mL | Freq: Once | INTRAMUSCULAR | Status: AC
  Administered 2019-11-10: 0.5 mL via INTRAMUSCULAR
  Filled 2019-11-10: qty 0.5

## 2019-11-10 MED ORDER — CEPHALEXIN 500 MG PO CAPS: 500.0000 mg | ORAL_CAPSULE | Freq: Three times a day (TID) | ORAL | 0 refills | Status: AC

## 2019-11-10 MED ORDER — FENTANYL CITRATE (PF) 100 MCG/2ML IJ SOLN
50.0000 ug | Freq: Once | INTRAMUSCULAR | Status: AC
  Administered 2019-11-10: 50 ug via INTRAVENOUS

## 2019-11-10 MED ORDER — LIDOCAINE HCL 1 % IJ SOLN
20.0000 mL | Freq: Once | INTRAMUSCULAR | Status: DC
  Filled 2019-11-10: qty 20

## 2019-11-10 MED ORDER — ONDANSETRON 4 MG PO TBDP
4.0000 mg | ORAL_TABLET | Freq: Once | ORAL | Status: AC
  Administered 2019-11-10: 4 mg via ORAL
  Filled 2019-11-10: qty 1

## 2019-11-10 MED ORDER — CEPHALEXIN 500 MG PO CAPS: 500.0000 mg | ORAL_CAPSULE | Freq: Three times a day (TID) | ORAL | 0 refills | Status: DC

## 2019-11-10 MED ORDER — ONDANSETRON 4 MG PO TBDP: 4.0000 mg | ORAL_TABLET | Freq: Three times a day (TID) | ORAL | 0 refills | Status: AC | PRN

## 2019-11-10 MED ORDER — OXYCODONE-ACETAMINOPHEN 5-325 MG PO TABS
1.0000 | ORAL_TABLET | Freq: Once | ORAL | Status: AC
  Administered 2019-11-10: 1 via ORAL
  Filled 2019-11-10: qty 1

## 2019-11-10 MED ORDER — OXYCODONE-ACETAMINOPHEN 5-325 MG PO TABS: 1.0000 | ORAL_TABLET | Freq: Four times a day (QID) | ORAL | 0 refills | Status: DC | PRN

## 2019-11-10 MED ORDER — OXYCODONE-ACETAMINOPHEN 5-325 MG PO TABS: 1.0000 | ORAL_TABLET | Freq: Four times a day (QID) | ORAL | 0 refills | Status: AC | PRN

## 2019-11-10 MED ORDER — FENTANYL CITRATE (PF) 100 MCG/2ML IJ SOLN
100.0000 ug | Freq: Once | INTRAMUSCULAR | Status: DC
  Filled 2019-11-10: qty 2

## 2019-11-10 NOTE — Discharge Instructions (Addendum)
Please call phone number for Dr. Bryson Ha office and make an appointment. You have been prescribed antibiotic and pain medicine to be picked up at the [facility] office at [address]. Please return to the emergency department with new or worsening symptoms. External sutures placed in the emergency department today need to be removed after 7 days.

## 2019-11-10 NOTE — ED Provider Notes (Signed)
Emergency Department Provider Note  ____________________________________________  Time seen: Approximately 6:13 PM  I have reviewed the triage vital signs and the nursing notes.   HISTORY  Chief Complaint Hand Injury   Historian Patient     HPI Lee Molina is a 54 y.o. male presents to the emergency department after a crush type injury that occurred at work.  Patient states that he was trying to stop a large roll of paper from rolling and machine crushed his hand.  Patient sustained lacerations to the dorsal and volar aspect of the left hand.  Patient has had some numbness along the dorsal aspect of left hand since injury occurred.  No similar injuries in the past.  Patient cannot recall his last tetanus shot.   Past Medical History:  Diagnosis Date  . Hayfever      Immunizations up to date:  Yes.     Past Medical History:  Diagnosis Date  . Hayfever     There are no problems to display for this patient.   History reviewed. No pertinent surgical history.  Prior to Admission medications   Medication Sig Start Date End Date Taking? Authorizing Provider  cephALEXin (KEFLEX) 500 MG capsule Take 1 capsule (500 mg total) by mouth 3 (three) times daily for 7 days. 11/10/19 11/17/19  Orvil FeilWoods, Brynne Doane M, PA-C  cyclobenzaprine (FLEXERIL) 10 MG tablet Take 10 mg by mouth every other day.      [provider]  methylPREDNISolone (MEDROL DOSEPAK) 4 MG TBPK tablet Take Tapered dose as directed 08/23/18   Joni ReiningSmith, Ronald K, PA-C  ondansetron (ZOFRAN ODT) 4 MG disintegrating tablet Take 1 tablet (4 mg total) by mouth every 8 (eight) hours as needed for up to 5 days. 11/10/19 11/15/19  Orvil FeilWoods, Hau Sanor M, PA-C  orphenadrine (NORFLEX) 100 MG tablet Take 1 tablet (100 mg total) by mouth 2 (two) times daily. 08/23/18   Joni ReiningSmith, Ronald K, PA-C  oxyCODONE-acetaminophen (PERCOCET/ROXICET) 5-325 MG tablet Take 1 tablet by mouth every 6 (six) hours as needed for up to 5 days. 11/10/19  11/15/19  Orvil FeilWoods, Jaysie Benthall M, PA-C    Allergies Patient has no known allergies.  History reviewed. No pertinent family history.  Social History Social History   Tobacco Use  . Smoking status: Never Smoker  . Smokeless tobacco: Never Used  Substance Use Topics  . Alcohol use: Never  . Drug use: Never     Review of Systems  Constitutional: No fever/chills Eyes:  No discharge ENT: No upper respiratory complaints. Respiratory: no cough. No SOB/ use of accessory muscles to breath Gastrointestinal:   No nausea, no vomiting.  No diarrhea.  No constipation. Musculoskeletal: Patient has left hand pain.  Skin: Negative for rash, abrasions, lacerations, ecchymosis.    ____________________________________________   PHYSICAL EXAM:  VITAL SIGNS: ED Triage Vitals  Enc Vitals Group     BP --      Pulse Rate 11/10/19 1619 64     Resp 11/10/19 1619 20     Temp 11/10/19 1619 97.7 F (36.5 C)     Temp Source 11/10/19 1619 Oral     SpO2 11/10/19 1619 94 %     Weight 11/10/19 1618 196 lb (88.9 kg)     Height 11/10/19 1618 5\' 9"  (1.753 m)     Head Circumference --      Peak Flow --      Pain Score 11/10/19 1617 10     Pain Loc --      Pain  Edu? --      Excl. in GC? --      Constitutional: Alert and oriented. Well appearing and in no acute distress. Eyes: Conjunctivae are normal. PERRL. EOMI. Head: Atraumatic. ENT:      Ears:       Nose: No congestion/rhinnorhea.      Mouth/Throat: Mucous membranes are moist.  Neck: No stridor.  No cervical spine tenderness to palpation. Cardiovascular: Normal rate, regular rhythm. Normal S1 and S2.  Good peripheral circulation. Respiratory: Normal respiratory effort without tachypnea or retractions. Lungs CTAB. Good air entry to the bases with no decreased or absent breath sounds Gastrointestinal: Bowel sounds x 4 quadrants. Soft and nontender to palpation. No guarding or rigidity. No distention. Musculoskeletal: Full range of motion to all  extremities. No obvious deformities noted Neurologic:  Normal for age. No gross focal neurologic deficits are appreciated.  Skin: Patient has laceration dorsal to the left hand.  Laceration is 5 cm in length by 1 cm in width deep to underlying adipose tissue.  Patient also has several small puncture wounds along the dorsal aspect of the left hand.  Patient has a 3 cm in length by 1 cm in width laceration along the volar aspect of the left hand at the base of the left second digit. Psychiatric: Mood and affect are normal for age. Speech and behavior are normal.   ____________________________________________   LABS (all labs ordered are listed, but only abnormal results are displayed)  Labs Reviewed - No data to display ____________________________________________  EKG   ____________________________________________  RADIOLOGY Geraldo Pitter, personally viewed and evaluated these images (plain radiographs) as part of my medical decision making, as well as reviewing the written report by the radiologist.  DG Hand Complete Left  Result Date: 11/10/2019 CLINICAL DATA:  Left hand injury EXAM: LEFT HAND - COMPLETE 3+ VIEW COMPARISON:  01/05/2011 FINDINGS: Comminuted, predominantly oblique fracture through the proximal metaphysis of the left index finger proximal phalanx with approximately 6 mm of ulnar displacement. There is a small amount of air within the adjacent soft tissues raising the suspicion for an open fracture. Obliquely oriented fracture through the third metacarpal head and neck with approximately 5 mm of proximal and ulnar displacement. Acute comminuted fracture of the fourth metacarpal neck with mild impaction and slight dorsoulnar displacement. No dislocation. No significant arthropathy. Diffuse soft tissue swelling and overlying bandaging material. Evidence of laceration at the dorsum of the hand. IMPRESSION: 1. Comminuted, displaced fractures of the left index finger proximal  phalanx, left third and fourth metacarpals, as described above. 2. Small amount of air within the soft tissues adjacent to the index finger raising the suspicion for an open fracture. Electronically Signed   By: Duanne Guess D.O.   On: 11/10/2019 16:54    ____________________________________________    PROCEDURES  Procedure(s) performed:     Marland KitchenMarland KitchenLaceration Repair  Date/Time: 11/10/2019 6:38 PM Performed by: Orvil Feil, PA-C Authorized by: Orvil Feil, PA-C   Consent:    Consent obtained:  Verbal   Consent given by:  Patient Anesthesia (see MAR for exact dosages):    Anesthesia method:  None Laceration details:    Location:  Hand   Hand location:  L hand, dorsum   Length (cm):  5   Depth (mm):  10 Repair type:    Repair type:  Intermediate Pre-procedure details:    Preparation:  Patient was prepped and draped in usual sterile fashion Exploration:    Contaminated: no  Treatment:    Area cleansed with:  Betadine   Amount of cleaning:  Standard   Irrigation solution:  Sterile saline   Irrigation volume:  500   Visualized foreign bodies/material removed: no   Skin repair:    Repair method:  Sutures   Suture size:  4-0   Suture technique:  Running locked   Number of sutures:  10 Approximation:    Approximation:  Close Post-procedure details:    Dressing:  Open (no dressing)   Patient tolerance of procedure:  Tolerated well, no immediate complications .Marland KitchenLaceration Repair  Date/Time: 11/10/2019 6:39 PM Performed by: Orvil Feil, PA-C Authorized by: Orvil Feil, PA-C   Consent:    Consent obtained:  Verbal   Consent given by:  Patient Anesthesia (see MAR for exact dosages):    Anesthesia method:  None Laceration details:    Location:  Hand   Hand location:  L palm   Length (cm):  3   Depth (mm):  10 Repair type:    Repair type:  Intermediate Exploration:    Contaminated: no   Treatment:    Area cleansed with:  Betadine   Amount of  cleaning:  Standard   Irrigation solution:  Sterile saline   Irrigation volume:  500   Visualized foreign bodies/material removed: no   Skin repair:    Repair method:  Tissue adhesive and sutures   Suture size:  4-0   Suture technique:  Running locked   Number of sutures:  7 Approximation:    Approximation:  Close Post-procedure details:    Dressing:  Open (no dressing)   Patient tolerance of procedure:  Tolerated well, no immediate complications .Splint Application  Date/Time: 11/10/2019 6:40 PM Performed by: Orvil Feil, PA-C Authorized by: Orvil Feil, PA-C   Consent:    Consent obtained:  Verbal   Consent given by:  Patient Pre-procedure details:    Sensation:  Normal Procedure details:    Location:  Hand   Hand:  L hand   Splint type:  Volar short arm (with exptension to DIP ) Post-procedure details:    Pain:  Improved   Sensation:  Normal   Patient tolerance of procedure:  Tolerated well, no immediate complications       Medications  lidocaine (XYLOCAINE) 1 % (with pres) injection 20 mL (has no administration in time range)  Tdap (BOOSTRIX) injection 0.5 mL (has no administration in time range)  ondansetron (ZOFRAN-ODT) disintegrating tablet 4 mg (4 mg Oral Given 11/10/19 1636)  fentaNYL (SUBLIMAZE) injection 50 mcg (50 mcg Intravenous Given 11/10/19 1640)  fentaNYL (SUBLIMAZE) injection 50 mcg (50 mcg Intravenous Given 11/10/19 1712)     ____________________________________________   INITIAL IMPRESSION / ASSESSMENT AND PLAN / ED COURSE  Pertinent labs & imaging results that were available during my care of the patient were reviewed by me and considered in my medical decision making (see chart for details).      Assessment and Plan: Crush type injury Hand lacerations Third and fourth metacarpal fractures Second proximal phalanx fracture Open fractures 54 year old male presents to the emergency department with a crush type injury that occurred  at his place of work.  Vital signs were reassuring at triage.  On physical exam, patient could move all 5 left fingers.  Compartments along the left forearm are soft to the touch.  Patient had capillary refill less than 2 seconds on the left and sensation was intact.  X-rays of the left hand indicated third  and fourth metacarpal fractures as well as a left second proximal phalanx fracture.  Patient had a 5 cm laceration along the dorsal aspect of the left hand along with 3-4 small puncture wounds.  He had a 3 cm laceration along the volar aspect of the left hand overlying the left second MCP.  I consulted orthopedist on-call, Dr. Franco Collet who personally reviewed x-rays and pictures of patient's wounds.  He recommended irrigation and laceration repair in the emergency department as well as splinting with prophylactic antibiotic given open nature of fractures.  Patient's lacerations were repaired in the emergency department without complication.  A nonadherent dressing was applied and patient was placed in a splint in the emergency department.  Patient was discharged with Keflex and prescribed Percocet for pain.  Patient's tetanus status was updated prior to discharge.  He was given a follow-up referral to Dr. Stephenie Acres, hand specialist.     ____________________________________________  FINAL CLINICAL IMPRESSION(S) / ED DIAGNOSES  Final diagnoses:  Crush injury  Laceration of left hand without foreign body, initial encounter  Open fracture      NEW MEDICATIONS STARTED DURING THIS VISIT:  ED Discharge Orders         Ordered    oxyCODONE-acetaminophen (PERCOCET/ROXICET) 5-325 MG tablet  Every 6 hours PRN        11/10/19 1830    ondansetron (ZOFRAN ODT) 4 MG disintegrating tablet  Every 8 hours PRN        11/10/19 1830    cephALEXin (KEFLEX) 500 MG capsule  3 times daily        11/10/19 1830              This chart was dictated using voice recognition software/Dragon. Despite best  efforts to proofread, errors can occur which can change the meaning. Any change was purely unintentional.     Orvil Feil, PA-C 11/10/19 1843    Phineas Semen, MD 11/10/19 [phone]

## 2019-11-10 NOTE — ED Triage Notes (Signed)
Pt was at work, works at [employer].  Had a large roll of paper fall on hand when trying to stop a machine from rolling and cut hand from impact of injury. 4 open wounds to left hand. Active bleeding, wrapped. Decreased sensation to fingers.

## 2019-11-22 ENCOUNTER — Other Ambulatory Visit: Payer: Self-pay

## 2019-11-22 DIAGNOSIS — Z20822 Contact with and (suspected) exposure to covid-19: Secondary | ICD-10-CM

## 2019-11-24 LAB — SARS-COV-2, NAA 2 DAY TAT

## 2019-11-24 LAB — NOVEL CORONAVIRUS, NAA: SARS-CoV-2, NAA: NOT DETECTED

## 2020-09-12 DIAGNOSIS — S86009A Unspecified injury of unspecified Achilles tendon, initial encounter: Secondary | ICD-10-CM | POA: Insufficient documentation

## 2021-09-15 DIAGNOSIS — E785 Hyperlipidemia, unspecified: Secondary | ICD-10-CM | POA: Insufficient documentation

## 2021-09-15 DIAGNOSIS — E039 Hypothyroidism, unspecified: Secondary | ICD-10-CM | POA: Insufficient documentation

## 2021-09-15 DIAGNOSIS — J4540 Moderate persistent asthma, uncomplicated: Secondary | ICD-10-CM | POA: Insufficient documentation

## 2021-09-15 DIAGNOSIS — M1990 Unspecified osteoarthritis, unspecified site: Secondary | ICD-10-CM | POA: Insufficient documentation

## 2021-09-15 DIAGNOSIS — I10 Essential (primary) hypertension: Secondary | ICD-10-CM | POA: Insufficient documentation

## 2021-09-17 DIAGNOSIS — Z86718 Personal history of other venous thrombosis and embolism: Secondary | ICD-10-CM | POA: Insufficient documentation

## 2021-09-17 DIAGNOSIS — R1310 Dysphagia, unspecified: Secondary | ICD-10-CM | POA: Insufficient documentation

## 2021-09-17 DIAGNOSIS — K219 Gastro-esophageal reflux disease without esophagitis: Secondary | ICD-10-CM | POA: Insufficient documentation

## 2021-09-17 DIAGNOSIS — J309 Allergic rhinitis, unspecified: Secondary | ICD-10-CM | POA: Insufficient documentation

## 2021-09-17 DIAGNOSIS — N529 Male erectile dysfunction, unspecified: Secondary | ICD-10-CM | POA: Insufficient documentation

## 2021-09-17 DIAGNOSIS — Z800 Family history of malignant neoplasm of digestive organs: Secondary | ICD-10-CM | POA: Insufficient documentation

## 2021-12-22 LAB — HM COLONOSCOPY

## 2022-03-20 DIAGNOSIS — Z79899 Other long term (current) drug therapy: Secondary | ICD-10-CM | POA: Diagnosis not present

## 2022-03-20 DIAGNOSIS — E782 Mixed hyperlipidemia: Secondary | ICD-10-CM | POA: Diagnosis not present

## 2022-03-20 DIAGNOSIS — S6992XS Unspecified injury of left wrist, hand and finger(s), sequela: Secondary | ICD-10-CM | POA: Diagnosis not present

## 2022-03-20 DIAGNOSIS — I10 Essential (primary) hypertension: Secondary | ICD-10-CM | POA: Diagnosis not present

## 2022-03-20 DIAGNOSIS — E039 Hypothyroidism, unspecified: Secondary | ICD-10-CM | POA: Diagnosis not present

## 2022-03-20 DIAGNOSIS — Z83511 Family history of glaucoma: Secondary | ICD-10-CM | POA: Diagnosis not present

## 2022-03-20 DIAGNOSIS — J4540 Moderate persistent asthma, uncomplicated: Secondary | ICD-10-CM | POA: Diagnosis not present

## 2022-03-20 DIAGNOSIS — J309 Allergic rhinitis, unspecified: Secondary | ICD-10-CM | POA: Diagnosis not present

## 2022-03-20 DIAGNOSIS — S6992XA Unspecified injury of left wrist, hand and finger(s), initial encounter: Secondary | ICD-10-CM | POA: Insufficient documentation

## 2022-04-30 DIAGNOSIS — H2513 Age-related nuclear cataract, bilateral: Secondary | ICD-10-CM | POA: Diagnosis not present

## 2022-04-30 DIAGNOSIS — Z83511 Family history of glaucoma: Secondary | ICD-10-CM | POA: Diagnosis not present

## 2022-05-01 DIAGNOSIS — H5213 Myopia, bilateral: Secondary | ICD-10-CM | POA: Diagnosis not present

## 2022-07-02 DIAGNOSIS — J4520 Mild intermittent asthma, uncomplicated: Secondary | ICD-10-CM | POA: Diagnosis not present

## 2022-07-02 DIAGNOSIS — I10 Essential (primary) hypertension: Secondary | ICD-10-CM | POA: Diagnosis not present

## 2022-07-02 DIAGNOSIS — E7800 Pure hypercholesterolemia, unspecified: Secondary | ICD-10-CM | POA: Diagnosis not present

## 2022-07-06 DIAGNOSIS — H5213 Myopia, bilateral: Secondary | ICD-10-CM | POA: Diagnosis not present

## 2022-08-27 DIAGNOSIS — E7800 Pure hypercholesterolemia, unspecified: Secondary | ICD-10-CM | POA: Diagnosis not present

## 2022-08-27 DIAGNOSIS — L659 Nonscarring hair loss, unspecified: Secondary | ICD-10-CM | POA: Diagnosis not present

## 2022-08-27 DIAGNOSIS — I10 Essential (primary) hypertension: Secondary | ICD-10-CM | POA: Diagnosis not present

## 2022-11-12 DIAGNOSIS — Z114 Encounter for screening for human immunodeficiency virus [HIV]: Secondary | ICD-10-CM | POA: Diagnosis not present

## 2022-11-12 DIAGNOSIS — Z1211 Encounter for screening for malignant neoplasm of colon: Secondary | ICD-10-CM | POA: Diagnosis not present

## 2022-11-12 DIAGNOSIS — J4520 Mild intermittent asthma, uncomplicated: Secondary | ICD-10-CM | POA: Diagnosis not present

## 2022-11-12 DIAGNOSIS — I10 Essential (primary) hypertension: Secondary | ICD-10-CM | POA: Diagnosis not present

## 2022-11-12 DIAGNOSIS — E7800 Pure hypercholesterolemia, unspecified: Secondary | ICD-10-CM | POA: Diagnosis not present

## 2022-11-12 DIAGNOSIS — Z125 Encounter for screening for malignant neoplasm of prostate: Secondary | ICD-10-CM | POA: Diagnosis not present

## 2023-04-19 DIAGNOSIS — M25562 Pain in left knee: Secondary | ICD-10-CM | POA: Diagnosis not present

## 2023-04-23 DIAGNOSIS — M1712 Unilateral primary osteoarthritis, left knee: Secondary | ICD-10-CM | POA: Diagnosis not present

## 2023-04-23 DIAGNOSIS — M25562 Pain in left knee: Secondary | ICD-10-CM | POA: Diagnosis not present

## 2023-04-23 DIAGNOSIS — S8992XA Unspecified injury of left lower leg, initial encounter: Secondary | ICD-10-CM | POA: Diagnosis not present

## 2023-05-10 DIAGNOSIS — S8992XA Unspecified injury of left lower leg, initial encounter: Secondary | ICD-10-CM | POA: Diagnosis not present

## 2023-05-10 DIAGNOSIS — M25562 Pain in left knee: Secondary | ICD-10-CM | POA: Diagnosis not present

## 2023-06-04 DIAGNOSIS — S8992XA Unspecified injury of left lower leg, initial encounter: Secondary | ICD-10-CM | POA: Diagnosis not present

## 2023-06-04 DIAGNOSIS — S83512A Sprain of anterior cruciate ligament of left knee, initial encounter: Secondary | ICD-10-CM | POA: Diagnosis not present

## 2023-06-04 DIAGNOSIS — S83242A Other tear of medial meniscus, current injury, left knee, initial encounter: Secondary | ICD-10-CM | POA: Diagnosis not present

## 2023-06-04 DIAGNOSIS — S83272A Complex tear of lateral meniscus, current injury, left knee, initial encounter: Secondary | ICD-10-CM | POA: Diagnosis not present

## 2023-06-07 DIAGNOSIS — M1712 Unilateral primary osteoarthritis, left knee: Secondary | ICD-10-CM | POA: Diagnosis not present

## 2023-06-07 DIAGNOSIS — S83272A Complex tear of lateral meniscus, current injury, left knee, initial encounter: Secondary | ICD-10-CM | POA: Diagnosis not present

## 2023-06-07 DIAGNOSIS — S8992XD Unspecified injury of left lower leg, subsequent encounter: Secondary | ICD-10-CM | POA: Diagnosis not present

## 2023-06-10 DIAGNOSIS — J4520 Mild intermittent asthma, uncomplicated: Secondary | ICD-10-CM | POA: Diagnosis not present

## 2023-06-10 DIAGNOSIS — I10 Essential (primary) hypertension: Secondary | ICD-10-CM | POA: Diagnosis not present

## 2023-06-10 DIAGNOSIS — E782 Mixed hyperlipidemia: Secondary | ICD-10-CM | POA: Diagnosis not present

## 2023-06-16 DIAGNOSIS — S8992XD Unspecified injury of left lower leg, subsequent encounter: Secondary | ICD-10-CM | POA: Diagnosis not present

## 2023-06-16 DIAGNOSIS — S83204A Other tear of unspecified meniscus, current injury, left knee, initial encounter: Secondary | ICD-10-CM | POA: Insufficient documentation

## 2023-06-16 DIAGNOSIS — M1712 Unilateral primary osteoarthritis, left knee: Secondary | ICD-10-CM | POA: Diagnosis not present

## 2023-06-16 DIAGNOSIS — S83272A Complex tear of lateral meniscus, current injury, left knee, initial encounter: Secondary | ICD-10-CM | POA: Diagnosis not present

## 2023-07-01 DIAGNOSIS — J4540 Moderate persistent asthma, uncomplicated: Secondary | ICD-10-CM | POA: Diagnosis not present

## 2023-07-01 DIAGNOSIS — S83272A Complex tear of lateral meniscus, current injury, left knee, initial encounter: Secondary | ICD-10-CM | POA: Diagnosis not present

## 2023-07-01 DIAGNOSIS — Z79899 Other long term (current) drug therapy: Secondary | ICD-10-CM | POA: Diagnosis not present

## 2023-07-01 DIAGNOSIS — M2342 Loose body in knee, left knee: Secondary | ICD-10-CM | POA: Diagnosis not present

## 2023-07-01 DIAGNOSIS — E785 Hyperlipidemia, unspecified: Secondary | ICD-10-CM | POA: Diagnosis not present

## 2023-07-01 DIAGNOSIS — S83242A Other tear of medial meniscus, current injury, left knee, initial encounter: Secondary | ICD-10-CM | POA: Diagnosis not present

## 2023-07-01 DIAGNOSIS — Z791 Long term (current) use of non-steroidal anti-inflammatories (NSAID): Secondary | ICD-10-CM | POA: Diagnosis not present

## 2023-07-01 DIAGNOSIS — E039 Hypothyroidism, unspecified: Secondary | ICD-10-CM | POA: Diagnosis not present

## 2023-07-01 DIAGNOSIS — S83204A Other tear of unspecified meniscus, current injury, left knee, initial encounter: Secondary | ICD-10-CM | POA: Diagnosis not present

## 2023-07-01 DIAGNOSIS — S83204D Other tear of unspecified meniscus, current injury, left knee, subsequent encounter: Secondary | ICD-10-CM | POA: Diagnosis not present

## 2023-07-01 DIAGNOSIS — S83232D Complex tear of medial meniscus, current injury, left knee, subsequent encounter: Secondary | ICD-10-CM | POA: Diagnosis not present

## 2023-07-01 DIAGNOSIS — K219 Gastro-esophageal reflux disease without esophagitis: Secondary | ICD-10-CM | POA: Diagnosis not present

## 2023-07-01 DIAGNOSIS — I10 Essential (primary) hypertension: Secondary | ICD-10-CM | POA: Diagnosis not present

## 2023-07-15 DIAGNOSIS — I10 Essential (primary) hypertension: Secondary | ICD-10-CM | POA: Diagnosis not present

## 2023-07-15 DIAGNOSIS — L659 Nonscarring hair loss, unspecified: Secondary | ICD-10-CM | POA: Diagnosis not present

## 2023-07-15 DIAGNOSIS — M25562 Pain in left knee: Secondary | ICD-10-CM | POA: Diagnosis not present

## 2023-07-15 DIAGNOSIS — R6882 Decreased libido: Secondary | ICD-10-CM | POA: Diagnosis not present

## 2023-08-12 DIAGNOSIS — N529 Male erectile dysfunction, unspecified: Secondary | ICD-10-CM | POA: Diagnosis not present

## 2023-08-12 DIAGNOSIS — L659 Nonscarring hair loss, unspecified: Secondary | ICD-10-CM | POA: Diagnosis not present

## 2023-08-12 DIAGNOSIS — I10 Essential (primary) hypertension: Secondary | ICD-10-CM | POA: Diagnosis not present

## 2023-08-20 DIAGNOSIS — R600 Localized edema: Secondary | ICD-10-CM | POA: Diagnosis not present

## 2023-08-20 DIAGNOSIS — Z86718 Personal history of other venous thrombosis and embolism: Secondary | ICD-10-CM | POA: Diagnosis not present

## 2023-09-28 DIAGNOSIS — R2243 Localized swelling, mass and lump, lower limb, bilateral: Secondary | ICD-10-CM | POA: Diagnosis not present

## 2023-10-07 ENCOUNTER — Encounter (INDEPENDENT_AMBULATORY_CARE_PROVIDER_SITE_OTHER): Payer: Self-pay | Admitting: Vascular Surgery

## 2023-10-07 ENCOUNTER — Ambulatory Visit (INDEPENDENT_AMBULATORY_CARE_PROVIDER_SITE_OTHER): Admitting: Vascular Surgery

## 2023-10-07 VITALS — BP 118/65 | HR 77 | Resp 18 | Wt 210.4 lb

## 2023-10-07 DIAGNOSIS — I10 Essential (primary) hypertension: Secondary | ICD-10-CM

## 2023-10-07 DIAGNOSIS — E785 Hyperlipidemia, unspecified: Secondary | ICD-10-CM

## 2023-10-07 DIAGNOSIS — M7989 Other specified soft tissue disorders: Secondary | ICD-10-CM | POA: Diagnosis not present

## 2023-10-07 DIAGNOSIS — J4520 Mild intermittent asthma, uncomplicated: Secondary | ICD-10-CM | POA: Diagnosis not present

## 2023-10-14 ENCOUNTER — Encounter (INDEPENDENT_AMBULATORY_CARE_PROVIDER_SITE_OTHER): Payer: Self-pay | Admitting: Vascular Surgery

## 2023-10-14 NOTE — Progress Notes (Addendum)
 Subjective:    Patient ID: Lee Molina, male    DOB: 12-07-1965, 58 y.o.   MRN: [id number] Chief Complaint  Patient presents with   New Patient (Initial Visit)    Ref Lee Molina consult ble swelling    Lee Molina is a 58 yo male who presents to clinic today with chief complaint of bilateral lower extremity swelling.  Patient states swelling has been going on for multiple years now where he would start that he would take some of his medication and it would go down to 3 days later.  It may start again in another month or 2 months he will get strict with his diet and his medications go down again but now he endorses leg swelling continues on a daily basis.  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  They hurt because they are throbbing and aching.  He does not describe any specific pain but I understand it is starting to interfere with his daily work.  Patient endorses he was seen many years ago while he was in the [employer] service and at that time he was told to wear compression for his legs.  He was not compliant with this at that time.  He still complaint today.  He also does not endorse any medical history other than he was put on some blood pressure medications in the past but he is not regularly taking those either.  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  His GOTO is an extreme level of creatinine in which he feels if he does not take he is going to have as he had in the past difficulty with tendons ligaments and muscles.  He was not very specific in terms of what those problems were.  He endorses he has not tried any conventional therapy related to his bilateral lower extremity swelling at this time.    Review of Systems  Constitutional: Negative.   Cardiovascular:   Positive for leg swelling.  All other systems reviewed and are negative.      Objective:   Physical Exam Vitals reviewed.  Constitutional:      Appearance: Normal appearance. He is normal weight.  HENT:     Head: Normocephalic.  Eyes:     Pupils: Pupils are equal, round, and reactive to light.  Cardiovascular:     Rate and Rhythm: Normal rate and regular rhythm.     Pulses: Normal pulses.     Heart sounds: Normal heart sounds.  Pulmonary:     Effort: Pulmonary effort is normal.     Breath sounds: Normal breath sounds.  Abdominal:     General: Abdomen is flat. Bowel sounds are normal.     Palpations: Abdomen is soft.  Musculoskeletal:     Right lower leg: Edema present.     Left lower leg: Edema present.  Skin:    General: Skin is warm and dry.     Capillary Refill: Capillary refill takes 2 to 3 seconds.  Neurological:     General: No focal deficit present.     Mental Status: He is alert and oriented to person, place, and time. Mental status is at baseline.  Psychiatric:        Mood and Affect: Mood normal.  Thought Content: Thought content normal.        Judgment: Judgment normal.     BP 118/65   Pulse 77   Resp 18   Wt 210 lb 6.4 oz (95.4 kg)   BMI 31.07 kg/m   Past Medical History:  Diagnosis Date   Asthma    Hayfever    Hyperlipidemia     Social History   Socioeconomic History   Marital status: Single    Spouse name: Not on file   Number of children: Not on file   Years of education: Not on file   Highest education level: Not on file  Occupational History   Not on file  Tobacco Use   Smoking status: Never   Smokeless tobacco: Never  Vaping Use   Vaping status: Never Used  Substance and Sexual Activity   Alcohol use: Never   Drug use: Never   Sexual activity: Not on file  Other Topics Concern   Not on file  Social History Narrative   Not on file   Social Drivers of Health   Financial Resource Strain: Medium Risk (09/17/2021)    Received from [facility]   Overall Financial Resource Strain (CARDIA)    Difficulty of Paying Living Expenses: Somewhat hard  Food Insecurity: Not on File (11/26/2022)   Received from [facility] Insecurity    Food: 0  Transportation Needs: No Transportation Needs (09/17/2021)   Received from [facility]   PRAPARE - Transportation    Lack of Transportation (Medical): No    Lack of Transportation (Non-Medical): No  Physical Activity: Not on File (06/19/2021)   Received from [facility]   Physical Activity    Physical Activity: 0  Stress: Not on File (06/19/2021)   Received from [facility]   Stress    Stress: 0  Social Connections: Not on File (11/12/2022)   Received from [facility]   Social Connections    Connectedness: 0  Intimate Partner Violence: Not on file    Past Surgical History:  Procedure Laterality Date   HAND SURGERY Left    KNEE SURGERY Left    ROTATOR CUFF REPAIR      Family History  Problem Relation Age of Onset   Colon cancer Father     Allergies  Allergen Reactions   Other     Other Reaction(s): Other (See Comments)  Runny nose , congestion   Pollen Extract Other (See Comments)    Runny nose , congestion        No data to display            CMP  No results found for: NA, K, CL, CO2, GLUCOSE, BUN, CREATININE, CALCIUM, PROT, ALBUMIN, AST, ALT, ALKPHOS, BILITOT, GFR, EGFR, GFRNONAA   No results found.     Assessment & Plan:   1. Lymphedema (Primary) Recommend:  I have had a long discussion with the patient regarding swelling and why it  causes symptoms.  Patient will begin wearing graduated compression on a daily basis a prescription was given. The patient will  wear the stockings first thing in the morning and removing them in the evening. The patient is instructed specifically not to sleep in the stockings.   In addition, behavioral modification will be initiated.  This will include frequent elevation, use of  over the counter pain medications and exercise such as walking.  Consideration for a lymph pump will also be made based upon the effectiveness of conservative therapy.  This would  help to improve the edema control and prevent sequela such as ulcers and infections   Patient should undergo duplex ultrasound of the venous system to ensure that DVT or reflux is not present.  The patient will follow-up with me after the ultrasound.   I also had a long detailed discussion with the patient today regarding his use of dietary supplements in place of medication.  We also spoke about his lack of a primary care physician and using Dr. Darold Molt from orthopedics as his primary care.  I recommend that he find a primary care physician as soon as possible.  He states he is having labs drawn somewhere but they are not in the computer and previous labs that he has had drawn show a connection to why he was placed on certain medications.  2. Hypertension, unspecified type Continue antihypertensive medications as already ordered, these medications have been reviewed and there are no changes at this time.  3. Mild intermittent chronic asthma without complication Continue pulmonary medications and aerosols as already ordered, these medications have been reviewed and there are no changes at this time.   4. Hyperlipidemia, unspecified hyperlipidemia type Continue statin as ordered and reviewed, no changes at this time   Current Outpatient Medications on File Prior to Visit  Medication Sig Dispense Refill   ADVAIR DISKUS 100-50 MCG/ACT AEPB Inhale 1 puff into the lungs 2 (two) times daily.     albuterol (VENTOLIN HFA) 108 (90 Base) MCG/ACT inhaler Inhale 2 puffs into the lungs every 6 (six) hours as needed.     atorvastatin (LIPITOR) 20 MG tablet Take 20 mg by mouth daily.     ibuprofen (ADVIL) 800 MG tablet Take 800 mg by mouth.     lisinopril (ZESTRIL) 40 MG tablet Take 40 mg by mouth daily.      cyclobenzaprine (FLEXERIL) 10 MG tablet Take 10 mg by mouth every other day.   (Patient not taking: Reported on 10/07/2023)     methylPREDNISolone  (MEDROL  DOSEPAK) 4 MG TBPK tablet Take Tapered dose as directed (Patient not taking: Reported on 10/07/2023) 21 tablet 0   orphenadrine  (NORFLEX ) 100 MG tablet Take 1 tablet (100 mg total) by mouth 2 (two) times daily. (Patient not taking: Reported on 10/07/2023) 10 tablet 0   No current facility-administered medications on file prior to visit.    There are no Patient Instructions on file for this visit. No follow-ups on file.   Gwendlyn JONELLE Shank, NP

## 2023-10-22 NOTE — Addendum Note (Signed)
 Addended by: ELISABETH ROUND on: 10/22/2023 07:39 AM   Modules accepted: Orders

## 2023-11-03 ENCOUNTER — Telehealth (INDEPENDENT_AMBULATORY_CARE_PROVIDER_SITE_OTHER): Payer: Self-pay

## 2023-11-03 NOTE — Telephone Encounter (Signed)
 He should follow back up with orthopedic surgery to see about his thigh and hamstring numbness as this isn't vascular.

## 2023-11-03 NOTE — Telephone Encounter (Signed)
 Patient has been notified with medical recommendations

## 2023-11-03 NOTE — Telephone Encounter (Signed)
 Patient left a message stating that he has new symptoms that started 3 week ago. Patient is having bilateral thigh and hamstring numbness. Also he is having left hip pain. Patient stated that he has reached out to his PCP. Patient was last seen in the office 10/07/23. Please Advise

## 2023-11-11 DIAGNOSIS — I890 Lymphedema, not elsewhere classified: Secondary | ICD-10-CM | POA: Diagnosis not present

## 2023-11-18 DIAGNOSIS — E039 Hypothyroidism, unspecified: Secondary | ICD-10-CM | POA: Diagnosis not present

## 2023-11-18 DIAGNOSIS — Z131 Encounter for screening for diabetes mellitus: Secondary | ICD-10-CM | POA: Diagnosis not present

## 2023-11-18 DIAGNOSIS — M7989 Other specified soft tissue disorders: Secondary | ICD-10-CM | POA: Diagnosis not present

## 2023-11-18 DIAGNOSIS — I10 Essential (primary) hypertension: Secondary | ICD-10-CM | POA: Diagnosis not present

## 2023-11-18 DIAGNOSIS — Z125 Encounter for screening for malignant neoplasm of prostate: Secondary | ICD-10-CM | POA: Diagnosis not present

## 2023-11-18 DIAGNOSIS — Z114 Encounter for screening for human immunodeficiency virus [HIV]: Secondary | ICD-10-CM | POA: Diagnosis not present

## 2023-11-18 DIAGNOSIS — Z23 Encounter for immunization: Secondary | ICD-10-CM | POA: Diagnosis not present

## 2023-11-18 DIAGNOSIS — E782 Mixed hyperlipidemia: Secondary | ICD-10-CM | POA: Diagnosis not present

## 2023-11-18 NOTE — Progress Notes (Signed)
 Subjective   Lee Molina is a 58 year old male here for Swelling   (Bilateral leg swelling and discomfort. X 72yr over the past 2 months it has gotten worst. )   Pt into the office for follow up.  Swelling in bilateral legs but Left > Right leg - more in the left leg especially after his knee surgery. Recently sent to Ortho sent vascular - CT done and negative. Vascular was not impressed with his case.  Was told to decrease potassium Dx with lymphedema in the past. Wore Ted hose and it improved.  Swelling is not there in the morning. Swelling will come during the day. Sodium - Trying to use pink salt.  Workout - daily for 1 hour per day. Doing weights and also trains people  Notes that weight is actually going down. Normally 210 and is now 201.       Swelling   This is a recurrent problem. The current episode started more than 1 month ago. The problem occurs intermittently. The problem has been waxing and waning. Pertinent negatives include no abdominal pain, congestion or coughing. Nothing aggravates the symptoms. Lee Molina has tried nothing for the symptoms. The treatment provided no relief.      Documentation Reviewed by Any User: Tobacco  Allergies  Meds  Med Hx   Surg Hx  Fam Hx  Soc Hx    General Medical History     Diagnosis Date Comment Source   Allergy      Essential hypertension      Hyperlipidemia      Hypothyroidism         Family History     Problem Relation Age of Onset Comments   Diabetes Mellitus II Mother        Tobacco Use     Never smoked or used smokeless tobacco.      Vaping Use     Never used       Alcohol Use     Yes.   Comments: ocassionally      Drug Use     Never.      Sexual Activity     Sexually active; Partners: Female; Birth Control/Protection: Condom.      E-Cigarettes/Vaping     Questions Responses   e-Cigarette/Vaping Use Never user   Start Date    Passive Exposure    Quit Date    Counseling Given     Comments       E-Cigarette/Vaping Substances     Questions Responses   Nicotine No   THC No   CBD No   Flavoring No   Other       E-Cigarettes/Vaping Devices     Questions Responses   Disposable No   Pre-filled or Refillable Cartridge No   Refillable Tank No   Pre-filled Pod No   Other        Current Outpatient Medications  Medication Sig Dispense Refill  . tiotropium (SPIRIVA WITH HANDIHALER) 18 mcg capsule for inhaler Inhale 1 Capsule into the lungs once daily inhale the contents of one capsule (18 mcg) using 2 inhalations by inhalation route once daily via handihaler. 90 Capsule 1  . atorvastatin (LIPITOR) 20 mg tablet Take 1 tablet by mouth once daily 90 Tablet 0  . meloxicam (MOBIC) 15 mg tablet TAKE 1 TABLET BY MOUTH ONCE DAILY FOR  RIGHT  SHOULDER  PAIN,  KNEE  PAIN 90 Tablet 0  . minoxidiL (LONITEN) 10 mg tablet Take 1 Tablet by  mouth once daily. 30 Tablet 3  . sildenafiL (VIAGRA) 100 mg tablet Take 1 Tablet by mouth once daily as needed for erectile dysfunction. 10 Tablet 0  . albuterol HFA (VENTOLIN HFA) 90 mcg/actuation inhaler Inhale 2 Puffs into the lungs every 4 (four) hours as needed for shortness of breath 18 g 3  . fluticasone (FLONASE) 50 mcg/actuation nasal spray Place 1 Spray in both nostrils 2 (two) times daily 48 g 1  . fluticasone propion-salmeteroL (ADVAIR DISKUS) 100-50 mcg/dose diskus inhaler Inhale 1 Puff into the lungs 2 (two) times daily 180 Each 1  . lisinopriL 40 mg tablet Take 1 Tablet by mouth once daily 90 Tablet 1   Review of Systems  HENT:  Negative for congestion.   Respiratory:  Negative for cough.   Cardiovascular:  Positive for leg swelling.  Gastrointestinal:  Negative for abdominal pain.     Objective   BP 118/60  Pulse 60  Temp 98.3 F (36.8 C)  Ht 5' 9 (1.753 m)  Wt 201 lb 6.4 oz (91.4 kg)  SpO2 98%  BMI 29.74 kg/m  Smoking Status Never  BSA 2.11 m  Physical Exam  HENT:     Head: Normocephalic.    Cardiovascular:     Rate and Rhythm: Normal rate and regular rhythm.     Heart sounds: Normal heart sounds.  Pulmonary:     Breath sounds: Normal breath sounds.  Abdominal:     General: Bowel sounds are normal.     Palpations: Abdomen is soft.     Tenderness: There is no abdominal tenderness.  Musculoskeletal:     Cervical back: Normal range of motion.  Neurological:     Mental Status: Lee Molina is alert and oriented to person, place, and time.  Psychiatric:        Attention and Perception: Attention normal.        Mood and Affect: Mood normal.        Behavior: Behavior is cooperative.     Assessment and Plan   1. Primary hypertension (Primary) -     BLOOD COUNT COMPLETE AUTO&AUTO DIFRNTL WBC Routine -     COMPREHENSIVE METABOLIC PANEL Routine -     MICROALBUMIN/CREATININE RATIO, URINE, RANDOM Urine Routine 2. Mixed hyperlipidemia -     LIPID PANEL Routine 3. Hypothyroidism, unspecified type -     THYROID  PANEL WITH TSH Routine 4. Screening for diabetes mellitus -     HEMOGLOBIN GLYCOSYLATED A1C Routine 5. Prostate cancer screening -     PROSTATE SPECIFIC ANTIGEN, FREE AND TOTAL Routine 6. Screening for HIV (human immunodeficiency virus) -     HIV 1/0/2 AG/AB W/CASCADE RFLX SUPPLEMENTAL TESTING Routine 7. Leg swelling Other orders The following orders have not been finalized: -     atorvastatin (LIPITOR) 20 mg tablet -     lisinopriL 40 mg tablet   Regulatory Documentation: The following were addressed in today's visit:  Regulatory documentation not addressed.

## 2023-12-06 DIAGNOSIS — M1712 Unilateral primary osteoarthritis, left knee: Secondary | ICD-10-CM | POA: Diagnosis not present

## 2023-12-06 DIAGNOSIS — S83204D Other tear of unspecified meniscus, current injury, left knee, subsequent encounter: Secondary | ICD-10-CM | POA: Diagnosis not present

## 2023-12-06 NOTE — Progress Notes (Signed)
 ------------------------------------------------------------------------------- Attestation with edits by Carolee Lamar Blunt, MD at 12/07/23 [phone] I have seen and examined the patient.  I agree with the  assessment and plan as written with the exceptions noted.  Robert A. Creighton MD  -------------------------------------------------------------------------------  SPORTS MEDICINE POSTOPERATIVE VISIT  DATE OF PROCEDURE: 07/01/2023  PROCEDURE PERFORMED: Left knee partial medial and lateral meniscectomy and loose body removal and early wear especially of his patellofemoral and lateral compartment with grade 3 and 4 wear   ASSESSMENT    Lee Molina Clarence Mickey. is a 58 y.o. male s/p above  PLAN:  --We provided him with another physical therapy referral today. He may continue home exercise as tolerated. He may use over the counter medications as needed for pain and/or swelling. We discussed that he should not take Meloxicam and Ibuprofen at the same time. We discussed the possibility of viscosupplementation injection but unfortunately these are not covered by his insurance. We will plan to see him back in two months as scheduled for re-evaluation. All questions were answered and he is in agreement with plan.   --Imaging findings, further diagnostic and therapeutic options were reviewed with the patient, along with the benefits and drawbacks of each, and the patient expressed understanding  Prescriptions today: Physical therapy  Follow-up: 2 months  X-rays at next visit:  None  SUBJECTIVE   History of Present Illness: Lee Molina is a 58 y.o. male who presents for follow up s/p above procedure. He is s/p left knee corticosteroid injection on 10/06/23. He notes the injection helped some but he is still having pain in the lateral aspect of his knee as well as his hamstrings. He has been taking Meloxicam and Ibuprofen as needed for pain and swelling. He does feel as though his knee is  giving out on occasion. He has not been working with physical therapy, stating he was never provided with a referral. He has been working out on his own.   Surgical Hx Past Surgical History[1]    OBJECTIVE   Physical Exam:  DETAILED PHYSICAL EXAM General Appearance well-nourished and no acute distress  Vitals Estimated body mass index is 29.56 kg/m as calculated from the following:   Height as of 07/01/23: 175.3 cm (5' 9).   Weight as of 07/01/23: 90.8 kg (200 lb 2.8 oz).  Mood and Affect alert, cooperative, and pleasant     Cardiovascular well-perfused distally and no swelling   MUSCULOSKELETAL  LEFT:    KNEE:  Incisions healed Mild effusion. No erythema or deformity.  Patellofemoral crepitus  ROM 2-130 Stable ligamentous exam     Imaging/other tests: No new imaging obtained today.    Orthopaedic PROMIS: Failed to redirect to the Timeline version of the REVFS SmartLink.     * No data to display          PRO Status: Incomplete.          [1] Past Surgical History: Procedure Laterality Date  . PR COLSC FLX W/RMVL OF TUMOR POLYP LESION SNARE TQ N/A 12/22/2021   Procedure: COLONOSCOPY FLEX; W/REMOV TUMOR/LES BY SNARE;  Surgeon: Gwenyth Prentice Agent, MD;  Location: GI PROC[facility];  Service: Gastroenterology  . PR KNEE SCOPE,REMV LOOSE BODY Left 07/01/2023   Procedure: ARTHROSCOPY, KNEE SURGICAL; FOR REMOVAL LOOSE BODY OR FOREIGN BODY (EG, OSTEOCHONDRITIS DISSECANS FRAGMENT);  Surgeon: Carolee Lamar Blunt, MD;  Location: OR [facility];  Service: Orthopedics  . PR KNEE SCOPE,SINGLE MENISECTOMY Left 07/01/2023   Procedure: ARTHROSCOPY, KNEE; W/MENISECTMY(MEDIAL/LAT, INCL MENISCAL SHAVE)  DEBRIDE/SHAVE ARTICULAR CART(CHONDROPLAST);  Surgeon: Carolee Lamar Blunt, MD;  Location: OR [facility];  Service: Orthopedics  . PR SHLDR ARTHROSCOP,PART ACROMIOPLAS Right 02/14/2019   Procedure: ARTHROSCOPY, SHOULDER, SURGICAL; DECOMPRESS SUBACROMIAL SPACE  W/PART ACROMIOPLASTY, ASTRID BING;  Surgeon: Lamar Blunt Carolee, MD;  Location: ASC OR [facility];  Service: Orthopedics  . PR SHLDR ARTHROSCOP,SURG,DIS CLAVICULECTOMY Right 02/14/2019   Procedure: R13 ARTHROSCOPY, SHOULDER, SURGICAL; DISTAL CLAVICULECTOMY INCLUDING DISTAL ARTICULAR SURFACE;  Surgeon: Lamar Blunt Carolee, MD;  Location: ASC OR [facility];  Service: Orthopedics  . PR SHLDR ARTHROSCOP,SURG,W/ROTAT CUFF REPR Right 02/14/2019   Procedure: ARTHROSCOPY, SHOULDER, SURGICAL; WITH ROTATOR CUFF REPAIR;  Surgeon: Lamar Blunt Carolee, MD;  Location: ASC OR [facility];  Service: Orthopedics  . PR SURGICAL ARTHROSCOPY SHOULDER XTNSV DBRDMT 3+ Right 02/14/2019   Procedure: ARTHROSCOPY SHOULDER SURG; DEBRID EXTEN;  Surgeon: Lamar Blunt Carolee, MD;  Location: ASC OR [facility];  Service: Orthopedics  . PR UPPER GI ENDOSCOPY,DIAGNOSIS N/A 12/22/2021   Procedure: UGI ENDO, INCLUDE ESOPHAGUS, STOMACH, & DUODENUM &/OR JEJUNUM; DX W/WO COLLECTION SPECIMN, BY BRUSH OR WASH;  Surgeon: Gwenyth Prentice Agent, MD;  Location: [facility];  Service: Gastroenterology  . SHOULDER SURGERY    *Some images could not be shown.

## 2023-12-13 ENCOUNTER — Ambulatory Visit: Admitting: Student in an Organized Health Care Education/Training Program

## 2023-12-13 NOTE — Progress Notes (Unsigned)
  Cardiology Office Note   Date:  12/13/2023  ID:  Lee Molina, DOB 14-Feb-1966, MRN [id number] PCP: Patient, No Pcp Per  [facility] Providers Cardiologist:  None { Click to update primary MD,subspecialty MD or APP then REFRESH:1}    History of Present Illness Lee Molina is a 58 y.o. male PMH HLD, HTN who presents for further evaluation and management of bilateral lower extremity edema.  ***.  Last LDL 91 11/2023.  Relevant CVD History -None   ROS: Pt denies any chest discomfort, jaw pain, arm pain, palpitations, syncope, presyncope, orthopnea, PND, or LE edema.  Studies Reviewed I have independently reviewed the patient's ECG, recent medical records, recent blood work.  Physical Exam VS:  There were no vitals taken for this visit.       Wt Readings from Last 3 Encounters:  10/07/23 210 lb 6.4 oz (95.4 kg)  11/10/19 196 lb (88.9 kg)  08/23/18 194 lb (88 kg)    GEN: No acute distress. NECK: No JVD; No carotid bruits. CARDIAC: ***RRR, no murmurs, rubs, gallops. RESPIRATORY:  Clear to auscultation. EXTREMITIES:  Warm and well-perfused. No edema.  ASSESSMENT AND PLAN LE edema HLD        {Are you ordering a CV Procedure (e.g. stress test, cath, DCCV, TEE, etc)?   Press F2        :[id number]}  Dispo: ***  Signed, Caron Poser, MD

## 2023-12-15 ENCOUNTER — Ambulatory Visit

## 2023-12-16 ENCOUNTER — Ambulatory Visit: Admitting: Pediatrics

## 2023-12-16 ENCOUNTER — Encounter: Payer: Self-pay | Admitting: Pediatrics

## 2023-12-16 VITALS — BP 100/63 | HR 64 | Temp 98.1°F | Ht 70.0 in | Wt 198.6 lb

## 2023-12-16 DIAGNOSIS — I10 Essential (primary) hypertension: Secondary | ICD-10-CM | POA: Diagnosis not present

## 2023-12-16 DIAGNOSIS — E039 Hypothyroidism, unspecified: Secondary | ICD-10-CM | POA: Diagnosis not present

## 2023-12-16 DIAGNOSIS — Z1330 Encounter for screening examination for mental health and behavioral disorders, unspecified: Secondary | ICD-10-CM | POA: Diagnosis not present

## 2023-12-16 DIAGNOSIS — Z7689 Persons encountering health services in other specified circumstances: Secondary | ICD-10-CM | POA: Diagnosis not present

## 2023-12-16 DIAGNOSIS — L03011 Cellulitis of right finger: Secondary | ICD-10-CM | POA: Diagnosis not present

## 2023-12-16 DIAGNOSIS — R600 Localized edema: Secondary | ICD-10-CM | POA: Diagnosis not present

## 2023-12-16 MED ORDER — DOXYCYCLINE HYCLATE 100 MG PO TABS: 100.0000 mg | ORAL_TABLET | Freq: Two times a day (BID) | ORAL | 0 refills | Status: AC

## 2023-12-16 MED ORDER — FUROSEMIDE 20 MG PO TABS: 20.0000 mg | ORAL_TABLET | Freq: Every day | ORAL | 1 refills | Status: DC | PRN

## 2023-12-16 NOTE — Patient Instructions (Signed)

## 2023-12-16 NOTE — Progress Notes (Signed)
 Establish Care Note  BP 100/63   Pulse 64   Temp 98.1 F (36.7 C) (Oral)   Ht 5' 10 (1.778 m)   Wt 198 lb 9.6 oz (90.1 kg)   SpO2 97%   BMI 28.50 kg/m    Subjective:    Patient ID: Lee Molina, male    DOB: 09/12/1965, 58 y.o.   MRN: [id number]  HPI: Lee Molina is a 58 y.o. male  Chief Complaint  Patient presents with   Establish Care    Had a appointment with cardio and missed concerned about swelling in his legs believes he is retaining fluids     Establishing care, the following was discussed today:  Discussed the use of AI scribe software for clinical note transcription with the patient, who gave verbal consent to proceed.  History of Present Illness   Lee Molina is a 59 year old male with a history of lymphedema who presents with leg swelling.  He has experienced leg swelling for several years, initially diagnosed as lymphedema in 2017 while incarcerated. Treatment with compression socks and medication resolved the swelling at that time. However, the swelling has recurred and is now noticeable to his son. It worsens throughout the day, particularly when he is on his feet, and is less severe in the morning. The swelling can become significant enough that the contours of his calves are not visible, causing discomfort and heaviness in his legs.  He is currently on lisinopril and atorvastatin for high blood pressure and cholesterol management, although he had stopped and recently restarted the atorvastatin. 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 He recalls being advised to take thyroid  medication years ago but is uncertain about current use. He mentions recent abnormal thyroid  levels.  He has made dietary changes, including reducing dairy intake and limiting banana consumption, to manage his weight, which has decreased from 207 to 195 pounds. He remains very active, engaging in personal training  and coaching, and is involved with his grandchildren's activities.  He uses compression socks but is reluctant to wear them continuously. He reports a recent skin issue on his hand, which he attributes to pinching it at the gym, and notes it was worse on Saturday but is improving. He notes that the swelling worsens throughout the day. Reports discomfort and heaviness in legs.        Current Outpatient Medications on File Prior to Visit  Medication Sig Dispense Refill   ADVAIR DISKUS 100-50 MCG/ACT AEPB Inhale 1 puff into the lungs 2 (two) times daily.     albuterol (VENTOLIN HFA) 108 (90 Base) MCG/ACT inhaler Inhale 2 puffs into the lungs every 6 (six) hours as needed.     aspirin 81 MG chewable tablet Chew 81 mg by mouth.     atorvastatin (LIPITOR) 20 MG tablet Take 20 mg by mouth daily.     lisinopril (ZESTRIL) 40 MG tablet Take 40 mg by mouth daily.     meloxicam (MOBIC) 15 MG tablet TAKE 1 TABLET BY MOUTH ONCE DAILY FOR  RIGHT  SHOULDER  PAIN,  KNEE  PAIN     minoxidil (LONITEN) 10 MG tablet Take 10 mg by mouth daily.     sildenafil (VIAGRA) 100 MG tablet Take 100 mg by mouth daily as needed for erectile dysfunction.     Tiotropium Bromide 18 MCG CAPS Place 18 mcg into inhaler and inhale.     triamcinolone acetonide (  KENALOG-40) 40 MG/ML injection Inject 40 mg into the articular space.     fluticasone (FLONASE) 50 MCG/ACT nasal spray Place 1 spray into both nostrils 2 (two) times daily.     No current facility-administered medications on file prior to visit.    #HM Will review HM records and updated as needed.  Relevant past medical, surgical, family and social history reviewed and updated as indicated. Interim medical history since our last visit reviewed. Allergies and medications reviewed and updated.  ROS per HPI unless specifically indicated above     Objective:    BP 100/63   Pulse 64   Temp 98.1 F (36.7 C) (Oral)   Ht 5' 10 (1.778 m)   Wt 198 lb 9.6 oz (90.1 kg)    SpO2 97%   BMI 28.50 kg/m   Wt Readings from Last 3 Encounters:  12/16/23 198 lb 9.6 oz (90.1 kg)  10/07/23 210 lb 6.4 oz (95.4 kg)  11/10/19 196 lb (88.9 kg)     Physical Exam Constitutional:      Appearance: Normal appearance.  Pulmonary:     Effort: Pulmonary effort is normal.  Musculoskeletal:        General: Normal range of motion.     Right lower leg: Edema present.     Left lower leg: Edema present.     Comments: 1+ bilateral pitting edema to mid shin bilaterally  Skin:    Findings: Erythema present.     Comments: Right finger cut w erythema and swelling, minimal pus expressed  Neurological:     General: No focal deficit present.     Mental Status: He is alert. Mental status is at baseline.  Psychiatric:        Mood and Affect: Mood normal.        Behavior: Behavior normal.        Thought Content: Thought content normal.         12/16/2023   11:42 AM  Depression screen PHQ 2/9  Decreased Interest 0  Down, Depressed, Hopeless 0  PHQ - 2 Score 0  Altered sleeping 0  Tired, decreased energy 0  Change in appetite 0  Feeling bad or failure about yourself  0  Trouble concentrating 0  Moving slowly or fidgety/restless 0  Suicidal thoughts 0  PHQ-9 Score 0  Difficult doing work/chores Not difficult at all        12/16/2023   11:43 AM  GAD 7 : Generalized Anxiety Score  Nervous, Anxious, on Edge 0  Control/stop worrying 0  Worry too much - different things 0  Trouble relaxing 0  Restless 0  Easily annoyed or irritable 0  Afraid - awful might happen 0  Total GAD 7 Score 0  Anxiety Difficulty Not difficult at all       Assessment & Plan:  Assessment & Plan   Lower extremity edema Chronic lymphedema with differential including cardiac, renal, and thyroid  causes. Pending cardiac evaluation. Slightly decreased kidney function necessitates caution with diuretics. - Order BMP to assess kidney function and heart strain. - Prescribe Lasix pending kidney  function results. - Advise morning dosing of Lasix to avoid nocturia. - Refer to cardiology for evaluation, including potential heart ultrasound. - Discuss Lasix side effects, including increased urination and dehydration risk. - Instruct to start Lasix after kidney function confirmation. - Prescribe antibiotic for skin infection. -     Brain natriuretic peptide -     Furosemide; Take 1 tablet (20 mg total) by  mouth daily as needed for edema or fluid.  Dispense: 30 tablet; Refill: 1  Hypothyroidism, unspecified type Assessment & Plan: Previous abnormal thyroid  levels. Thyroid  dysfunction may contribute to edema. - Check thyroid  function tests.  Orders: -     Thyroid  Panel With TSH  Hypertension, unspecified type On lisinopril 40mg . Well controlled. CTM. -     Basic metabolic panel with GFR -     Lipid panel  Cellulitis of finger of right hand Recent injury w knife. Appears to have cellulitis on finger. Plan to tx as below.  -     Doxycycline Hyclate; Take 1 tablet (100 mg total) by mouth 2 (two) times daily for 5 days.  Dispense: 10 tablet; Refill: 0  Encounter to establish care Reviewed available patient record including history, medications, problem list. HM updated as able. Will review and/or request outside records (if applicable) and will fill remaining HM gaps as needed at follow up visit.  Encounter for behavioral health screening As part of their intake evaluation, the patient was screened for depression, anxiety.  PHQ9 SCORE 0, GAD7 SCORE 0. Screening results negative for tested conditions. See plan under problem/diagnosis above.   Follow up plan: Return in about 3 months (around 03/17/2024) for Chronic illness f/u.  Hadassah SHAUNNA Nett, MD

## 2023-12-17 LAB — BASIC METABOLIC PANEL WITH GFR
BUN/Creatinine Ratio: 17 (ref 9–20)
BUN: 28 mg/dL — ABNORMAL HIGH (ref 6–24)
CO2: 20 mmol/L (ref 20–29)
Calcium: 10.2 mg/dL (ref 8.7–10.2)
Chloride: 105 mmol/L (ref 96–106)
Creatinine, Ser: 1.66 mg/dL — ABNORMAL HIGH (ref 0.76–1.27)
Glucose: 75 mg/dL (ref 70–99)
Potassium: 4.3 mmol/L (ref 3.5–5.2)
Sodium: 139 mmol/L (ref 134–144)
eGFR: 47 mL/min/1.73 — ABNORMAL LOW (ref >59)

## 2023-12-17 LAB — THYROID PANEL WITH TSH
Free Thyroxine Index: 1.6 (ref 1.2–4.9)
T3 Uptake Ratio: 29 % (ref 24–39)
T4, Total: 5.6 ug/dL (ref 4.5–12.0)
TSH: 4.41 u[IU]/mL (ref 0.450–4.500)

## 2023-12-17 LAB — LIPID PANEL
Chol/HDL Ratio: 2.3 ratio (ref 0.0–5.0)
Cholesterol, Total: 150 mg/dL (ref 100–199)
HDL: 65 mg/dL (ref >39)
LDL Chol Calc (NIH): 63 mg/dL (ref 0–99)
Triglycerides: 126 mg/dL (ref 0–149)
VLDL Cholesterol Cal: 22 mg/dL (ref 5–40)

## 2023-12-17 LAB — BRAIN NATRIURETIC PEPTIDE: BNP: 7 pg/mL (ref 0.0–100.0)

## 2023-12-21 ENCOUNTER — Ambulatory Visit: Payer: Self-pay | Admitting: Pediatrics

## 2023-12-22 ENCOUNTER — Encounter: Payer: Self-pay | Admitting: Pediatrics

## 2023-12-22 NOTE — Assessment & Plan Note (Signed)
 Previous abnormal thyroid  levels. Thyroid  dysfunction may contribute to edema. - Check thyroid  function tests.

## 2023-12-22 NOTE — Progress Notes (Unsigned)
  Cardiology Office Note   Date:  12/22/2023  ID:  Gurney, Balthazor 11/09/1965, MRN [id number] PCP: Herold Hadassah SQUIBB, MD  [facility] Providers Cardiologist:  None { Click to update primary MD,subspecialty MD or APP then REFRESH:1}    History of Present Illness DRAVEN NATTER is a 58 y.o. male PMH HLD, HTN who presents for further evaluation and management of bilateral lower extremity edema.  ***.  Last LDL 91 11/2023.  BNP of 7 12/16/23.  No evidence of albuminuria 11/18/2023.  Relevant CVD History -None   ROS: Pt denies any chest discomfort, jaw pain, arm pain, palpitations, syncope, presyncope, orthopnea, PND, or LE edema.  Studies Reviewed I have independently reviewed the patient's ECG, recent medical records, recent blood work.  Physical Exam VS:  There were no vitals taken for this visit.       Wt Readings from Last 3 Encounters:  12/16/23 198 lb 9.6 oz (90.1 kg)  10/07/23 210 lb 6.4 oz (95.4 kg)  11/10/19 196 lb (88.9 kg)    GEN: No acute distress. NECK: No JVD; No carotid bruits. CARDIAC: ***RRR, no murmurs, rubs, gallops. RESPIRATORY:  Clear to auscultation. EXTREMITIES:  Warm and well-perfused. No edema.  ASSESSMENT AND PLAN LE edema HLD        {Are you ordering a CV Procedure (e.g. stress test, cath, DCCV, TEE, etc)?   Press F2        :[id number]}  Dispo: ***  Signed, Caron Poser, MD

## 2023-12-23 ENCOUNTER — Ambulatory Visit

## 2023-12-24 ENCOUNTER — Ambulatory Visit: Attending: Internal Medicine | Admitting: Internal Medicine

## 2023-12-24 ENCOUNTER — Encounter: Payer: Self-pay | Admitting: Internal Medicine

## 2023-12-24 VITALS — BP 116/66 | HR 63 | Ht 70.0 in | Wt 197.6 lb

## 2023-12-24 DIAGNOSIS — R590 Localized enlarged lymph nodes: Secondary | ICD-10-CM | POA: Insufficient documentation

## 2023-12-24 DIAGNOSIS — R600 Localized edema: Secondary | ICD-10-CM | POA: Diagnosis not present

## 2023-12-24 DIAGNOSIS — Z79899 Other long term (current) drug therapy: Secondary | ICD-10-CM | POA: Diagnosis not present

## 2023-12-24 DIAGNOSIS — N1831 Chronic kidney disease, stage 3a: Secondary | ICD-10-CM | POA: Diagnosis not present

## 2023-12-24 NOTE — Patient Instructions (Signed)
 Medication Instructions:  Your physician recommends the following medication changes.  STOP TAKING: Meloxicam  HOLD: Minoxidil for 1-2 weeks to see if it helps with your leg swelling.  *If you need a refill on your cardiac medications before your next appointment, please call your pharmacy*  Lab Work: Your provider would like for you to return in 1 month to have the following labs drawn: BMP.   Please go to [facility] [address] [address], [place] [address] You do not need an appointment.  They are open from 8 am- 4:30 pm.  Lunch from 1:00 pm- 2:00 pm You will not need to be fasting.    Testing/Procedures: Your physician has requested that you have an echocardiogram. Echocardiography is a painless test that uses sound waves to create images of your heart. It provides your doctor with information about the size and shape of your heart and how well your heart's chambers and valves are working.   You may receive an ultrasound enhancing agent through an IV if needed to better visualize your heart during the echo. This procedure takes approximately one hour.  There are no restrictions for this procedure.  This will take place at [address] [facility]) [address], [place] [address]  Please note: We ask at that you not bring children with you during ultrasound (echo/ vascular) testing. Due to room size and safety concerns, children are not allowed in the ultrasound rooms during exams. Our front office staff cannot provide observation of children in our lobby area while testing is being conducted. An adult accompanying a patient to their appointment will only be allowed in the ultrasound room at the discretion of the ultrasound technician under special circumstances. We apologize for any inconvenience.   Follow-Up: At [facility], you and your health needs are our priority.  As part of our continuing mission to provide you with  exceptional heart care, our providers are all part of one team.  This team includes your primary Cardiologist (physician) and Advanced Practice Providers or APPs (Physician Assistants and Nurse Practitioners) who all work together to provide you with the care you need, when you need it.  Your next appointment:   6 month(s)  Provider:   You may see Lonni Hanson, MD or one of the following Advanced Practice Providers on your designated Care Team:   Lonni Meager, NP Lesley Maffucci, PA-C Bernardino Bring, PA-C Cadence Kelford, PA-C Tylene Lunch, NP Barnie Hila, NP

## 2023-12-24 NOTE — Progress Notes (Signed)
 Cardiology Office Note:  .   Date:  12/25/2023  ID:  Lee Molina, DOB [birth year], MRN [id number] PCP: Lee Hadassah SQUIBB, MD  [facility] Providers Cardiologist:  None     History of Present Illness: .   Lee Molina is a 58 y.o. male with history of hypertension, hyperlipidemia, and asthma, who has been referred by [facility]ina for evaluation of leg edema.  Lee Molina reports that he has been having leg swelling and discomfort (described as tightness) for years.  It involves both legs and seems to have worsened a bit since he underwent meniscus surgery on the left knee this past spring.  The pain is usually minimal or absent when he first gets up in the morning and gets progressively worse throughout the day.  At one point, he noticed some discoloration in the calves as well, though this seems to be better.  He does not have any pain when walking.  He exercises routinely.  He denies chest pain, shortness of breath, palpitations, lightheadedness, orthopnea, and PND.  Lee Molina has been on minoxidil for years for management of alopecia.  Though he is prescribed 10 mg daily, he will often split this pill in half.  He also uses meloxicam regularly for joint pains associated with his workouts.  He supplements his workouts with creatine.  He denies having been told in the past that he has kidney problems.  He denies prior cardiac testing other than her recent lower extremity venous duplex at [facility] that did not show any significant abnormalities.  ROS: See HPI  Studies Reviewed: SABRA   EKG Interpretation Date/Time:  Friday December 24 2023 09:42:52 EDT Ventricular Rate:  63 PR Interval:  144 QRS Duration:  84 QT Interval:  384 QTC Calculation: 392 R Axis:   33  Text Interpretation: Normal sinus rhythm Normal ECG When compared with ECG of 14-Jun-2009 00:39, HEART RATE has increased Otherwise no significant change Confirmed by Lee Molina ([id number]) on 12/25/2023 9:35:54 AM     Bilateral lower extremity venous duplex (08/20/2023): No evidence of DVT or obstruction in either lower extremity.  Bilateral enlarged inguinal lymph nodes noted.  Risk Assessment/Calculations:             Physical Exam:   VS:  BP 116/66   Pulse 63   Ht 5' 10 (1.778 m)   Wt 197 lb 9.6 oz (89.6 kg)   SpO2 95%   BMI 28.35 kg/m    Wt Readings from Last 3 Encounters:  12/24/23 197 lb 9.6 oz (89.6 kg)  12/16/23 198 lb 9.6 oz (90.1 kg)  10/07/23 210 lb 6.4 oz (95.4 kg)    General:  NAD. Neck: No JVD or HJR. Lungs: Clear to auscultation bilaterally without wheezes or crackles. Heart: Regular rate and rhythm without murmurs, rubs, or gallops. Abdomen: Soft, nontender, nondistended. Extremities: 1+ pitting edema in both calves.  ASSESSMENT AND PLAN: .    Bilateral lower extremity edema: This has been present for years but may have worsened after left meniscus surgery in the spring.  Subsequent venous duplex was negative for DVT or other significant abnormality.  Lee Molina does not have any other signs or symptoms of heart failure.  However, I think it would be prudent to obtain an echocardiogram to exclude cardiomyopathy and pericardial effusion (in the setting of long-term minoxidil use).  Other potential issues include CKD (moderately impaired GFR noted on most recent labs), venous insufficiency/lymphedema (particularly with finding of bilateral inguinal lymphadenopathy on  venous duplex earlier this year), and medication side effects.  Minoxidil, in particular, could be driving some of his lower extremity edema.  Sodium retention from NSAIDs like meloxicam may also be playing a role.  I have encouraged him to stop using minoxidil for a week or 2 to see if this helps his edema.  I have also asked him to minimize (and ideally stop) using NSAIDs given elevated creatinine.  Chronic kidney disease stage IIIa: Elevated creatinine noted back to at least 09/2020, most recently 1.7 on  12/16/2023.  I worry this could be due to chronic NSAID use.  Use of creatine as part of his workout regimen is also a concern.  I have encouraged him to avoid both of these and to increase his water intake.  We will plan to repeat a BMP in about a month to see if his renal function has improved, particularly since we may need to pursue a CT of the abdomen and pelvis to further assess the incidentally noted inguinal lymphadenopathy on his venous duplex from 08/2023.  If creatinine does not improve on recheck, consultation with nephrology should be considered.  Inguinal lymphadenopathy: Enlarged inguinal lymph nodes noted in both groins on venous duplex through [facility] in 08/2023.  We discussed CT of the abdomen and pelvis to further assess for pelvic/retroperitoneal lymphadenopathy and process that could be contributing to this, but have agreed to defer this as a scan would ideally be performed with contrast and I would like to avoid IV contrast in the setting of his moderate renal impairment.  Hopefully, renal function will improve with aforementioned interventions.  Alopecia: Currently being treated with minoxidil.  We discussed my concerns for ongoing minoxidil therapy in the setting of his lower extremity edema and potential long-term cardiac side effects associated with this medication.  I have encouraged Lee Molina to undertake a 1 to 2-week minoxidil holiday to see if his leg swelling improves.  It would also be worthwhile for him to consult with a dermatologist to see if there are alternative strategies for management of his hair loss.    Dispo: BMP in 1 month; return to clinic in 6 weeks (after echo).  Signed, Lee Hanson, MD

## 2023-12-25 ENCOUNTER — Encounter: Payer: Self-pay | Admitting: Internal Medicine

## 2023-12-25 DIAGNOSIS — R600 Localized edema: Secondary | ICD-10-CM | POA: Insufficient documentation

## 2023-12-25 DIAGNOSIS — R590 Localized enlarged lymph nodes: Secondary | ICD-10-CM | POA: Insufficient documentation

## 2023-12-25 DIAGNOSIS — N1831 Chronic kidney disease, stage 3a: Secondary | ICD-10-CM | POA: Insufficient documentation

## 2023-12-29 DIAGNOSIS — R2689 Other abnormalities of gait and mobility: Secondary | ICD-10-CM | POA: Diagnosis not present

## 2023-12-29 DIAGNOSIS — M25662 Stiffness of left knee, not elsewhere classified: Secondary | ICD-10-CM | POA: Diagnosis not present

## 2023-12-29 DIAGNOSIS — M25562 Pain in left knee: Secondary | ICD-10-CM | POA: Diagnosis not present

## 2023-12-30 DIAGNOSIS — N529 Male erectile dysfunction, unspecified: Secondary | ICD-10-CM | POA: Diagnosis not present

## 2023-12-30 DIAGNOSIS — J3089 Other allergic rhinitis: Secondary | ICD-10-CM | POA: Diagnosis not present

## 2023-12-30 DIAGNOSIS — R600 Localized edema: Secondary | ICD-10-CM | POA: Diagnosis not present

## 2023-12-30 DIAGNOSIS — I10 Essential (primary) hypertension: Secondary | ICD-10-CM | POA: Diagnosis not present

## 2023-12-30 DIAGNOSIS — E782 Mixed hyperlipidemia: Secondary | ICD-10-CM | POA: Diagnosis not present

## 2024-01-04 DIAGNOSIS — R2689 Other abnormalities of gait and mobility: Secondary | ICD-10-CM | POA: Diagnosis not present

## 2024-01-04 DIAGNOSIS — M25662 Stiffness of left knee, not elsewhere classified: Secondary | ICD-10-CM | POA: Diagnosis not present

## 2024-01-04 DIAGNOSIS — M25562 Pain in left knee: Secondary | ICD-10-CM | POA: Diagnosis not present

## 2024-01-10 DIAGNOSIS — M25562 Pain in left knee: Secondary | ICD-10-CM | POA: Diagnosis not present

## 2024-01-10 DIAGNOSIS — R2689 Other abnormalities of gait and mobility: Secondary | ICD-10-CM | POA: Diagnosis not present

## 2024-01-10 DIAGNOSIS — M25662 Stiffness of left knee, not elsewhere classified: Secondary | ICD-10-CM | POA: Diagnosis not present

## 2024-01-17 DIAGNOSIS — R2689 Other abnormalities of gait and mobility: Secondary | ICD-10-CM | POA: Diagnosis not present

## 2024-01-17 DIAGNOSIS — M25562 Pain in left knee: Secondary | ICD-10-CM | POA: Diagnosis not present

## 2024-01-17 DIAGNOSIS — M25662 Stiffness of left knee, not elsewhere classified: Secondary | ICD-10-CM | POA: Diagnosis not present

## 2024-01-18 DIAGNOSIS — M1712 Unilateral primary osteoarthritis, left knee: Secondary | ICD-10-CM | POA: Diagnosis not present

## 2024-01-26 ENCOUNTER — Telehealth: Payer: Self-pay | Admitting: Internal Medicine

## 2024-01-26 DIAGNOSIS — Z79899 Other long term (current) drug therapy: Secondary | ICD-10-CM | POA: Diagnosis not present

## 2024-01-26 DIAGNOSIS — M25562 Pain in left knee: Secondary | ICD-10-CM | POA: Diagnosis not present

## 2024-01-26 DIAGNOSIS — R2689 Other abnormalities of gait and mobility: Secondary | ICD-10-CM | POA: Diagnosis not present

## 2024-01-26 DIAGNOSIS — M25662 Stiffness of left knee, not elsewhere classified: Secondary | ICD-10-CM | POA: Diagnosis not present

## 2024-01-26 NOTE — Telephone Encounter (Signed)
 Patient has concerns water pills are causing weight loss, please assist.

## 2024-01-26 NOTE — Telephone Encounter (Signed)
 Spoke with patient regarding weight loss.. patient states a few months ago when he started lasix  he was 16 and now is 192 and wanted to know if it was concerning. Patient also states he does not have a big appetite already and that he works out relatively every day and he feels good with his look. He states the swelling in his legs is no longer there and he does take the as needed lasix  almost daily. He is looking forward to having his echocardiogram done next month to find out more answers about what could be going on with his lower extremity swelling. I told patient that the weight loss is not concerning at this time and with him working out and other things it would be expected for him to lose some weight. Told patient if he lost a significant amount of weight for instance 15-20 lbs in one month, this would be more concerning. Patient is still staying hydrated and doing well. He says that he will be sleeping better tonight knowing this information. No further needs at this time.

## 2024-01-27 LAB — BASIC METABOLIC PANEL WITH GFR
BUN/Creatinine Ratio: 15 (ref 9–20)
BUN: 26 mg/dL — ABNORMAL HIGH (ref 6–24)
CO2: 25 mmol/L (ref 20–29)
Calcium: 10.7 mg/dL — ABNORMAL HIGH (ref 8.7–10.2)
Chloride: 101 mmol/L (ref 96–106)
Creatinine, Ser: 1.7 mg/dL — ABNORMAL HIGH (ref 0.76–1.27)
Glucose: 86 mg/dL (ref 70–99)
Potassium: 4.5 mmol/L (ref 3.5–5.2)
Sodium: 137 mmol/L (ref 134–144)
eGFR: 46 mL/min/1.73 — ABNORMAL LOW

## 2024-01-31 ENCOUNTER — Ambulatory Visit: Payer: Self-pay | Admitting: Internal Medicine

## 2024-02-01 NOTE — Progress Notes (Signed)
 Scheduled

## 2024-02-09 DIAGNOSIS — M25562 Pain in left knee: Secondary | ICD-10-CM | POA: Diagnosis not present

## 2024-02-09 DIAGNOSIS — M25662 Stiffness of left knee, not elsewhere classified: Secondary | ICD-10-CM | POA: Diagnosis not present

## 2024-02-09 DIAGNOSIS — R2689 Other abnormalities of gait and mobility: Secondary | ICD-10-CM | POA: Diagnosis not present

## 2024-02-10 ENCOUNTER — Ambulatory Visit: Attending: Internal Medicine

## 2024-02-10 DIAGNOSIS — R600 Localized edema: Secondary | ICD-10-CM | POA: Insufficient documentation

## 2024-02-10 DIAGNOSIS — I10 Essential (primary) hypertension: Secondary | ICD-10-CM | POA: Diagnosis not present

## 2024-02-10 DIAGNOSIS — L659 Nonscarring hair loss, unspecified: Secondary | ICD-10-CM | POA: Diagnosis not present

## 2024-02-11 LAB — ECHOCARDIOGRAM COMPLETE
AR max vel: 3.14 cm2
AV Area VTI: 2.81 cm2
AV Area mean vel: 2.91 cm2
AV Mean grad: 5 mmHg
AV Peak grad: 9.5 mmHg
Ao pk vel: 1.54 m/s
Area-P 1/2: 3.63 cm2
S' Lateral: 2.2 cm

## 2024-02-16 ENCOUNTER — Encounter: Payer: Self-pay | Admitting: Pediatrics

## 2024-02-16 ENCOUNTER — Ambulatory Visit: Admitting: Pediatrics

## 2024-02-16 VITALS — BP 97/64 | HR 80 | Temp 98.0°F | Ht 70.0 in | Wt 194.4 lb

## 2024-02-16 DIAGNOSIS — R600 Localized edema: Secondary | ICD-10-CM | POA: Diagnosis not present

## 2024-02-16 DIAGNOSIS — M25562 Pain in left knee: Secondary | ICD-10-CM | POA: Diagnosis not present

## 2024-02-16 DIAGNOSIS — N1831 Chronic kidney disease, stage 3a: Secondary | ICD-10-CM | POA: Diagnosis not present

## 2024-02-16 DIAGNOSIS — M25662 Stiffness of left knee, not elsewhere classified: Secondary | ICD-10-CM | POA: Diagnosis not present

## 2024-02-16 DIAGNOSIS — R2689 Other abnormalities of gait and mobility: Secondary | ICD-10-CM | POA: Diagnosis not present

## 2024-02-16 DIAGNOSIS — Z1211 Encounter for screening for malignant neoplasm of colon: Secondary | ICD-10-CM

## 2024-02-16 LAB — MICROALBUMIN, URINE WAIVED
Creatinine, Urine Waived: 200 mg/dL (ref 10–300)
Microalb, Ur Waived: 10 mg/L (ref 0–19)
Microalb/Creat Ratio: <30 mg/g (ref <30)

## 2024-02-16 NOTE — Patient Instructions (Signed)
 Plan on getting repeat labs and a kidney ultrasound

## 2024-02-16 NOTE — Progress Notes (Unsigned)
° °  Office Visit  BP 97/64   Pulse 80   Temp 98 F (36.7 C) (Oral)   Ht 5' 10 (1.778 m)   Wt 194 lb 6.4 oz (88.2 kg)   SpO2 98%   BMI 27.89 kg/m    Subjective:    Patient ID: Lee Molina, male    DOB: 20-Sep-1965, 58 y.o.   MRN: [id number]  HPI: Lee Molina is a 58 y.o. male  Chief Complaint  Patient presents with   Hyperlipidemia   Hypertension    Discussed the use of AI scribe software for clinical note transcription with the patient, who gave verbal consent to proceed.  History of Present Illness     Relevant past medical, surgical, family and social history reviewed and updated as indicated. Interim medical history since our last visit reviewed. Allergies and medications reviewed and updated.  ROS per HPI unless specifically indicated above     Objective:    BP 97/64   Pulse 80   Temp 98 F (36.7 C) (Oral)   Ht 5' 10 (1.778 m)   Wt 194 lb 6.4 oz (88.2 kg)   SpO2 98%   BMI 27.89 kg/m   Wt Readings from Last 3 Encounters:  02/16/24 194 lb 6.4 oz (88.2 kg)  12/24/23 197 lb 9.6 oz (89.6 kg)  12/16/23 198 lb 9.6 oz (90.1 kg)     Physical Exam      02/16/2024    1:41 PM 12/16/2023   11:42 AM  Depression screen PHQ 2/9  Decreased Interest 1 0  Down, Depressed, Hopeless 0 0  PHQ - 2 Score 1 0  Altered sleeping 1 0  Tired, decreased energy 1 0  Change in appetite 0 0  Feeling bad or failure about yourself  0 0  Trouble concentrating 0 0  Moving slowly or fidgety/restless 0 0  Suicidal thoughts 0 0  PHQ-9 Score 3 0   Difficult doing work/chores Not difficult at all Not difficult at all     Data saved with a previous flowsheet row definition       02/16/2024    1:43 PM 12/16/2023   11:43 AM  GAD 7 : Generalized Anxiety Score  Nervous, Anxious, on Edge 0 0  Control/stop worrying 0 0  Worry too much - different things 0 0  Trouble relaxing 0 0  Restless 0 0  Easily annoyed or irritable 0 0  Afraid - awful might happen 0 0   Total GAD 7 Score 0 0  Anxiety Difficulty Not difficult at all Not difficult at all       Assessment & Plan:  Assessment & Plan   Stage 3a chronic kidney disease (HCC) -     Renal function panel -     Microalbumin, Urine Waived -     US  RENAL; Future     Assessment and Plan Assessment & Plan      Follow up plan: Return in about 3 months (around 05/16/2024).  Hadassah SHAUNNA Nett, MD

## 2024-02-17 LAB — RENAL FUNCTION PANEL
Albumin: 4.1 g/dL (ref 3.8–4.9)
BUN/Creatinine Ratio: 17 (ref 9–20)
BUN: 27 mg/dL — ABNORMAL HIGH (ref 6–24)
CO2: 23 mmol/L (ref 20–29)
Calcium: 10.2 mg/dL (ref 8.7–10.2)
Chloride: 102 mmol/L (ref 96–106)
Creatinine, Ser: 1.6 mg/dL — ABNORMAL HIGH (ref 0.76–1.27)
Glucose: 83 mg/dL (ref 70–99)
Phosphorus: 2.9 mg/dL (ref 2.8–4.1)
Potassium: 4.1 mmol/L (ref 3.5–5.2)
Sodium: 139 mmol/L (ref 134–144)
eGFR: 49 mL/min/1.73 — ABNORMAL LOW (ref >59)

## 2024-02-18 ENCOUNTER — Ambulatory Visit: Payer: Self-pay | Admitting: Pediatrics

## 2024-02-18 DIAGNOSIS — R339 Retention of urine, unspecified: Secondary | ICD-10-CM

## 2024-02-18 DIAGNOSIS — N1330 Unspecified hydronephrosis: Secondary | ICD-10-CM

## 2024-02-20 ENCOUNTER — Encounter: Payer: Self-pay | Admitting: Pediatrics

## 2024-02-20 NOTE — Assessment & Plan Note (Addendum)
 Chronic kidney disease stage 3a with potential decline in kidney function. Cardiologist noted inguinal lymph node swelling in prior CT from June 2025 that may benefit from further imaging. Ruled out cardiac causes for LE edema. There was concern about his use of creatinine supplements as part of workout routine as well. Evaluating renal causes for swelling and lymph node enlargement. Consider nephrology referral.  - Ordered renal ultrasound  - Repeated kidney panel with urine analysis for proteinuria.

## 2024-02-20 NOTE — Assessment & Plan Note (Signed)
 Presumably secondary to CKD, managed with prn lasix  20mg . See discussion above.

## 2024-02-21 ENCOUNTER — Ambulatory Visit
Admission: RE | Admit: 2024-02-21 | Discharge: 2024-02-21 | Disposition: A | Discharge status: Home / Self Care | Source: Ambulatory Visit | Attending: Pediatrics | Admitting: Pediatrics

## 2024-02-21 DIAGNOSIS — N1831 Chronic kidney disease, stage 3a: Secondary | ICD-10-CM | POA: Diagnosis present

## 2024-02-25 ENCOUNTER — Encounter: Payer: Self-pay | Admitting: Pediatrics

## 2024-03-13 ENCOUNTER — Other Ambulatory Visit: Payer: Self-pay | Admitting: Pediatrics

## 2024-03-13 DIAGNOSIS — R600 Localized edema: Secondary | ICD-10-CM

## 2024-03-13 NOTE — Telephone Encounter (Unsigned)
 Copied from CRM [id number]. Topic: Clinical - Medication Refill >> Mar 13, 2024  4:13 PM Antwanette L wrote: Medication: furosemide  (LASIX ) 20 MG tablet  Has the patient contacted their pharmacy? No   This is the patient's preferred pharmacy:  [id number] - [place], [place] - [address] [place] [address] Phone: [phone] Fax: [phone] Hours: Not open 24 hours     Is this the correct pharmacy for this prescription? Yes  Has the prescription been filled recently? Yes. Last refill was 12/16/23  Is the patient out of the medication? Yes  Has the patient been seen for an appointment in the last year OR does the patient have an upcoming appointment? Yes. Last office visit with Dr. Herold was 02/16/24 and next appt is TOC with Jolene Cannady  Can we respond through MyChart? No. Patient can be reached at [phone]  Agent: Please be advised that Rx refills may take up to 3 business days. We ask that you follow-up with your pharmacy.

## 2024-03-14 MED ORDER — FUROSEMIDE 20 MG PO TABS: 20.0000 mg | ORAL_TABLET | Freq: Every day | ORAL | 1 refills | Status: AC | PRN

## 2024-03-14 NOTE — Telephone Encounter (Signed)
 Requested Prescriptions  Pending Prescriptions Disp Refills   furosemide  (LASIX ) 20 MG tablet 30 tablet 1    Sig: Take 1 tablet (20 mg total) by mouth daily as needed for edema or fluid.     Cardiovascular:  Diuretics - Loop Failed - 03/14/2024  3:05 PM      Failed - Cr in normal range and within 180 days    Creatinine, Ser  Date Value Ref Range Status  02/16/2024 1.60 (H) 0.76 - 1.27 mg/dL Final         Failed - Mg Level in normal range and within 180 days    No results found for: MG       Passed - K in normal range and within 180 days    Potassium  Date Value Ref Range Status  02/16/2024 4.1 3.5 - 5.2 mmol/L Final         Passed - Ca in normal range and within 180 days    Calcium  Date Value Ref Range Status  02/16/2024 10.2 8.7 - 10.2 mg/dL Final         Passed - Na in normal range and within 180 days    Sodium  Date Value Ref Range Status  02/16/2024 139 134 - 144 mmol/L Final         Passed - Cl in normal range and within 180 days    Chloride  Date Value Ref Range Status  02/16/2024 102 96 - 106 mmol/L Final         Passed - Last BP in normal range    BP Readings from Last 1 Encounters:  02/16/24 97/64         Passed - Valid encounter within last 6 months    Recent Outpatient Visits           3 weeks ago Stage 3a chronic kidney disease (HCC)   [place] [facility] Herold Hadassah SQUIBB, MD   2 months ago Lower extremity edema   [place] [facility] Herold Hadassah SQUIBB, MD       Future Appointments             In 1 week Ge[place]ne, Penne SAUNDERS, MD [facility]    In 3 months Vivienne, Lonni Ingle, NP [facility]

## 2024-03-15 DIAGNOSIS — N1330 Unspecified hydronephrosis: Secondary | ICD-10-CM | POA: Insufficient documentation

## 2024-03-15 NOTE — Progress Notes (Signed)
" ° °  03/22/24 2:21 PM   Lee Molina 1965-06-21 [id number]   HPI: 59 y.o. male here for initial evaluation of left hydronephrosis  Recent workup for CKD  - RBUS (02/21/2024)-incomplete bladder emptying, mild left hydronephrosis.  Bladder volume not calculated.  Prostate 20-30 g.  Has never seen a urologist No prior GU conditions or issues Family history of prostate cancer in father  - His most recent PSA 0.05 November 2023 Denies nephrolithiasis, flank pain, GH, UTIs  History of BLE on on as needed Lasix    PMH: Past Medical History:  Diagnosis Date   Adhesive bursitis of right shoulder 01/13/2019   Asthma    Complex tear of meniscus of left knee as current injury 06/16/2023   Dysphagia 09/17/2021   Family history of colon cancer in father 09/17/2021   Hair loss 08/12/2023   Hayfever    Hyperlipidemia     Surgical History: Past Surgical History:  Procedure Laterality Date   HAND SURGERY Left    KNEE SURGERY Left    ROTATOR CUFF REPAIR      Family History: Family History  Problem Relation Age of Onset   Hypertension Mother    Diabetes Mother    Colon cancer Father     Social History:  reports that he has never smoked. He has never used smokeless tobacco. He reports that he does not currently use alcohol. He reports that he does not use drugs.      Physical Exam: BP 128/79   Pulse 66   Ht 5' 10 (1.778 m)   Wt 194 lb (88 kg)   BMI 27.84 kg/m    Constitutional:  Alert and oriented, No acute distress. Cardiovascular: No clubbing, cyanosis, or edema. Respiratory: Normal respiratory effort, no increased work of breathing. GI: Nondistended Skin: No rashes, bruises or suspicious lesions. Neurologic: Grossly intact, no focal deficits, moving all 4 extremities. Psychiatric: Normal mood and affect.  Laboratory Data: Component Ref Range & Units 1 yr ago  PROSTATE SPECIFIC AG, SERUM 0.0 - 4.0 ng/mL 0.6     Pertinent Imaging: I have personally viewed  and interpreted the RBUS 02/21/2024-mild left hydronephrosis with slight caliectasis.  Incomplete bladder emptying although volume not calculated.  Otherwise agree with read.    Assessment & Plan:    Hydronephrosis of left kidney Assessment & Plan: Mild left hydronephrosis  - Incidental, seen on RBUS (Dec 2025)  - Likely secondary to urinary retention, reflux  PVR 0 mL today  - Nonurgent interval RBUS in 3-4 months  Orders: -     Bladder Scan (Post Void Residual) in office -     US  RENAL; Future  Incomplete bladder emptying Assessment & Plan: PVR 0 ml today  Perhaps transient incomplete bladder emptying noted on ultrasound from December 2025.  Reviewed this in the context of his incidentally noted left hydronephrosis which is likely secondary.  I would not be aggressive in further workup or intervention at this time, particularly in the absence of any other clinical red flags or risk factors.  - Will recheck 1 more PVR and symptom evaluation in 3-4 months, as above       Penne Skye, MD 03/22/2024  [facility] [address], [address] [place], [place] [address] [phone] "

## 2024-03-15 NOTE — Assessment & Plan Note (Addendum)
 Mild left hydronephrosis  - Incidental, seen on RBUS (Dec 2025)  - Likely secondary to urinary retention, reflux  PVR 0 mL today  - Nonurgent interval RBUS in 3-4 months

## 2024-03-17 ENCOUNTER — Ambulatory Visit: Admitting: Pediatrics

## 2024-03-22 ENCOUNTER — Ambulatory Visit (INDEPENDENT_AMBULATORY_CARE_PROVIDER_SITE_OTHER): Admitting: Urology

## 2024-03-22 VITALS — BP 128/79 | HR 66 | Ht 70.0 in | Wt 194.0 lb

## 2024-03-22 DIAGNOSIS — N1330 Unspecified hydronephrosis: Secondary | ICD-10-CM | POA: Diagnosis not present

## 2024-03-22 DIAGNOSIS — R339 Retention of urine, unspecified: Secondary | ICD-10-CM | POA: Insufficient documentation

## 2024-03-22 LAB — BLADDER SCAN AMB NON-IMAGING: Scan Result: 0

## 2024-03-22 NOTE — Patient Instructions (Signed)
 Please contact Central Scheduling to set up your Ultrasound at [phone].

## 2024-03-22 NOTE — Assessment & Plan Note (Addendum)
 PVR 0 ml today  Perhaps transient incomplete bladder emptying noted on ultrasound from December 2025.  Reviewed this in the context of his incidentally noted left hydronephrosis which is likely secondary.  I would not be aggressive in further workup or intervention at this time, particularly in the absence of any other clinical red flags or risk factors.  - Will recheck 1 more PVR and symptom evaluation in 3-4 months, as above

## 2024-05-23 ENCOUNTER — Encounter: Admitting: Nurse Practitioner

## 2024-06-14 ENCOUNTER — Ambulatory Visit

## 2024-06-22 ENCOUNTER — Ambulatory Visit: Admitting: Nurse Practitioner

## 2024-06-28 ENCOUNTER — Ambulatory Visit: Admitting: Urology
